# Patient Record
Sex: Male | Born: 1975 | Race: White | Hispanic: No | Marital: Single | Smoking: Current every day smoker
Health system: Southern US, Community
[De-identification: ages and names within clinical notes are randomized; demographics above are authoritative.]

## PROBLEM LIST (undated history)

## (undated) DIAGNOSIS — N289 Disorder of kidney and ureter, unspecified: Secondary | ICD-10-CM

## (undated) DIAGNOSIS — N200 Calculus of kidney: Secondary | ICD-10-CM

## (undated) DIAGNOSIS — M549 Dorsalgia, unspecified: Secondary | ICD-10-CM

## (undated) DIAGNOSIS — F1910 Other psychoactive substance abuse, uncomplicated: Secondary | ICD-10-CM

## (undated) HISTORY — PX: WRIST SURGERY: SHX841

---

## 2008-12-07 ENCOUNTER — Emergency Department (HOSPITAL_COMMUNITY): Admission: EM | Admit: 2008-12-07 | Discharge: 2008-12-07 | Payer: Self-pay | Admitting: Emergency Medicine

## 2009-09-27 ENCOUNTER — Emergency Department (HOSPITAL_COMMUNITY): Admission: EM | Admit: 2009-09-27 | Discharge: 2009-09-27 | Payer: Self-pay | Admitting: Emergency Medicine

## 2010-01-19 ENCOUNTER — Emergency Department (HOSPITAL_BASED_OUTPATIENT_CLINIC_OR_DEPARTMENT_OTHER): Admission: EM | Admit: 2010-01-19 | Discharge: 2010-01-19 | Payer: Self-pay | Admitting: Emergency Medicine

## 2010-02-25 ENCOUNTER — Emergency Department (HOSPITAL_BASED_OUTPATIENT_CLINIC_OR_DEPARTMENT_OTHER): Admission: EM | Admit: 2010-02-25 | Discharge: 2010-02-25 | Payer: Self-pay | Admitting: Emergency Medicine

## 2010-03-29 ENCOUNTER — Emergency Department (HOSPITAL_BASED_OUTPATIENT_CLINIC_OR_DEPARTMENT_OTHER): Admission: EM | Admit: 2010-03-29 | Discharge: 2010-03-29 | Payer: Self-pay | Admitting: Emergency Medicine

## 2010-05-12 ENCOUNTER — Emergency Department (HOSPITAL_COMMUNITY): Admission: EM | Admit: 2010-05-12 | Discharge: 2010-05-12 | Payer: Self-pay | Admitting: Emergency Medicine

## 2010-07-21 ENCOUNTER — Emergency Department (HOSPITAL_COMMUNITY): Admission: EM | Admit: 2010-07-21 | Discharge: 2010-07-21 | Payer: Self-pay | Admitting: Emergency Medicine

## 2010-08-16 ENCOUNTER — Emergency Department (HOSPITAL_COMMUNITY): Admission: EM | Admit: 2010-08-16 | Discharge: 2010-08-16 | Payer: Self-pay | Admitting: Emergency Medicine

## 2010-09-04 ENCOUNTER — Emergency Department (HOSPITAL_COMMUNITY): Admission: EM | Admit: 2010-09-04 | Discharge: 2010-09-04 | Payer: Self-pay | Admitting: Emergency Medicine

## 2010-10-25 ENCOUNTER — Emergency Department (HOSPITAL_BASED_OUTPATIENT_CLINIC_OR_DEPARTMENT_OTHER)
Admission: EM | Admit: 2010-10-25 | Discharge: 2010-10-25 | Payer: Self-pay | Source: Home / Self Care | Admitting: Emergency Medicine

## 2010-11-27 ENCOUNTER — Emergency Department (HOSPITAL_BASED_OUTPATIENT_CLINIC_OR_DEPARTMENT_OTHER)
Admission: EM | Admit: 2010-11-27 | Discharge: 2010-11-27 | Disposition: A | Discharge status: Home / Self Care | Payer: Self-pay | Attending: Emergency Medicine | Admitting: Emergency Medicine

## 2010-11-27 DIAGNOSIS — M545 Low back pain, unspecified: Secondary | ICD-10-CM | POA: Insufficient documentation

## 2010-11-27 DIAGNOSIS — F17200 Nicotine dependence, unspecified, uncomplicated: Secondary | ICD-10-CM | POA: Insufficient documentation

## 2010-11-27 DIAGNOSIS — G8929 Other chronic pain: Secondary | ICD-10-CM | POA: Insufficient documentation

## 2010-11-27 LAB — URINALYSIS, ROUTINE W REFLEX MICROSCOPIC
Bilirubin Urine: NEGATIVE
Hgb urine dipstick: NEGATIVE
Ketones, ur: NEGATIVE mg/dL
Nitrite: NEGATIVE
Specific Gravity, Urine: 1.024 (ref 1.005–1.030)
Urobilinogen, UA: 0.2 mg/dL (ref 0.0–1.0)

## 2010-12-08 ENCOUNTER — Emergency Department (HOSPITAL_BASED_OUTPATIENT_CLINIC_OR_DEPARTMENT_OTHER)
Admission: EM | Admit: 2010-12-08 | Discharge: 2010-12-08 | Disposition: A | Discharge status: Home / Self Care | Payer: Self-pay | Attending: Emergency Medicine | Admitting: Emergency Medicine

## 2010-12-08 ENCOUNTER — Emergency Department (INDEPENDENT_AMBULATORY_CARE_PROVIDER_SITE_OTHER): Payer: Self-pay

## 2010-12-08 DIAGNOSIS — X500XXA Overexertion from strenuous movement or load, initial encounter: Secondary | ICD-10-CM | POA: Insufficient documentation

## 2010-12-08 DIAGNOSIS — S335XXA Sprain of ligaments of lumbar spine, initial encounter: Secondary | ICD-10-CM | POA: Insufficient documentation

## 2010-12-08 DIAGNOSIS — G8929 Other chronic pain: Secondary | ICD-10-CM | POA: Insufficient documentation

## 2011-03-25 ENCOUNTER — Emergency Department (HOSPITAL_BASED_OUTPATIENT_CLINIC_OR_DEPARTMENT_OTHER)
Admission: EM | Admit: 2011-03-25 | Discharge: 2011-03-25 | Disposition: A | Discharge status: Home / Self Care | Payer: Self-pay | Attending: Emergency Medicine | Admitting: Emergency Medicine

## 2011-03-25 ENCOUNTER — Emergency Department (INDEPENDENT_AMBULATORY_CARE_PROVIDER_SITE_OTHER): Payer: Self-pay

## 2011-03-25 DIAGNOSIS — M549 Dorsalgia, unspecified: Secondary | ICD-10-CM | POA: Insufficient documentation

## 2011-03-25 DIAGNOSIS — M25519 Pain in unspecified shoulder: Secondary | ICD-10-CM

## 2011-03-25 DIAGNOSIS — Y9289 Other specified places as the place of occurrence of the external cause: Secondary | ICD-10-CM | POA: Insufficient documentation

## 2011-03-25 DIAGNOSIS — X500XXA Overexertion from strenuous movement or load, initial encounter: Secondary | ICD-10-CM | POA: Insufficient documentation

## 2011-03-25 DIAGNOSIS — F17200 Nicotine dependence, unspecified, uncomplicated: Secondary | ICD-10-CM | POA: Insufficient documentation

## 2011-03-25 DIAGNOSIS — G8929 Other chronic pain: Secondary | ICD-10-CM | POA: Insufficient documentation

## 2011-03-25 DIAGNOSIS — S4350XA Sprain of unspecified acromioclavicular joint, initial encounter: Secondary | ICD-10-CM | POA: Insufficient documentation

## 2011-08-01 ENCOUNTER — Encounter: Payer: Self-pay | Admitting: *Deleted

## 2011-08-01 ENCOUNTER — Emergency Department (HOSPITAL_BASED_OUTPATIENT_CLINIC_OR_DEPARTMENT_OTHER)
Admission: EM | Admit: 2011-08-01 | Discharge: 2011-08-01 | Disposition: A | Discharge status: Home / Self Care | Payer: Self-pay | Attending: Emergency Medicine | Admitting: Emergency Medicine

## 2011-08-01 ENCOUNTER — Emergency Department (INDEPENDENT_AMBULATORY_CARE_PROVIDER_SITE_OTHER): Payer: Self-pay

## 2011-08-01 DIAGNOSIS — W2209XA Striking against other stationary object, initial encounter: Secondary | ICD-10-CM | POA: Insufficient documentation

## 2011-08-01 DIAGNOSIS — S60229A Contusion of unspecified hand, initial encounter: Secondary | ICD-10-CM | POA: Insufficient documentation

## 2011-08-01 DIAGNOSIS — Y92009 Unspecified place in unspecified non-institutional (private) residence as the place of occurrence of the external cause: Secondary | ICD-10-CM | POA: Insufficient documentation

## 2011-08-01 DIAGNOSIS — M7989 Other specified soft tissue disorders: Secondary | ICD-10-CM

## 2011-08-01 DIAGNOSIS — M79609 Pain in unspecified limb: Secondary | ICD-10-CM

## 2011-08-01 DIAGNOSIS — F17200 Nicotine dependence, unspecified, uncomplicated: Secondary | ICD-10-CM | POA: Insufficient documentation

## 2011-08-01 MED ORDER — OXYCODONE-ACETAMINOPHEN 5-325 MG PO TABS: 2.0000 | ORAL_TABLET | ORAL | Status: AC | PRN

## 2011-08-01 MED ORDER — OXYCODONE-ACETAMINOPHEN 5-325 MG PO TABS
1.0000 | ORAL_TABLET | Freq: Once | ORAL | Status: AC
  Administered 2011-08-01: 1 via ORAL
  Filled 2011-08-01: qty 1

## 2011-08-01 NOTE — ED Notes (Signed)
Pt was throwing bags through a doorway and struck his left hand on the door. Pt has swelling and pain in his left hand.

## 2011-08-01 NOTE — ED Provider Notes (Signed)
History     CSN: [id number] Arrival date & time: 08/01/2011  7:14 PM   First MD Initiated Contact with Patient 08/01/11 1939      Chief Complaint  Patient presents with  . Hand Injury    (Consider location/radiation/quality/duration/timing/severity/associated sxs/prior treatment) Patient is a 35 y.o. male presenting with hand injury. The history is provided by the patient.  Hand Injury  The incident occurred 1 to 2 hours ago. The incident occurred at home. Injury mechanism: The patient was throwing objects and hit his left hand against a door frame causing pain to dorsal 2nd and 3rd digits proximally. The pain is at a severity of 10/10. The pain has been constant since the incident. He has tried nothing for the symptoms.    History reviewed. No pertinent past medical history.  Past Surgical History  Procedure Date  . Wrist surgery     No family history on file.  History  Substance Use Topics  . Smoking status: Current Everyday Smoker  . Smokeless tobacco: Not on file  . Alcohol Use: No      Review of Systems  Constitutional: Negative.   Respiratory: Negative.   Cardiovascular: Negative.   Musculoskeletal:       See HPI.  Skin: Negative.     Allergies  Darvocet; Morphine and related; and Ibuprofen  Home Medications  No current outpatient prescriptions on file.  BP 128/79  Pulse 80  Temp(Src) 98.5 F (36.9 C) (Oral)  Resp 18  Ht 6\' 1"  (1.854 m)  Wt 187 lb (84.823 kg)  BMI 24.67 kg/m2  SpO2 100%  Physical Exam  Constitutional: He appears well-developed and well-nourished.  Musculoskeletal:       Left hand without significant swelling, discoloration or bony abnormality. Tender over proximal 2nd and 3rd digits extending to 2nd and 3rd MCP joints. Patient refuses any movement secondary to pain. Distal vascularity normal.    ED Course  Procedures (including critical care time)  Labs Reviewed - No data to display Dg Hand Complete Left  08/01/2011   *RADIOLOGY REPORT*  Clinical Data: Struck doorway.  Swelling and pain.  Unable to fully extend hand.  LEFT HAND - COMPLETE 3+ VIEW  Comparison: 10/25/2010 left forearm  Findings: Views of the left hand show flexed digit, limiting full evaluation of the fingers.  There is no evidence for acute fracture or subluxation.  Well corticated bone fragments are seen adjacent to the distal ulna, consistent with old ulnar styloid fracture or accessory ossicles.  No radiopaque foreign body or soft tissue gas identified.  IMPRESSION:  1. No evidence for acute  abnormality. 2.  Partially flexed digits as described. If there is a specific concern regarding a finger, dedicated digit views would be recommended.  Original Report Authenticated By: Patterson Hammersmith, M.D.     No diagnosis found.    MDM          Rodena Medin, PA 08/01/11 2019

## 2011-08-02 NOTE — ED Provider Notes (Signed)
Medical screening examination/treatment/procedure(s) were performed by non-physician practitioner and as supervising physician I was immediately available for consultation/collaboration.   Lovena Kluck L Nikola Blackston, MD 08/02/11 0246 

## 2011-10-08 ENCOUNTER — Encounter (HOSPITAL_BASED_OUTPATIENT_CLINIC_OR_DEPARTMENT_OTHER): Payer: Self-pay | Admitting: *Deleted

## 2011-10-08 ENCOUNTER — Emergency Department (HOSPITAL_BASED_OUTPATIENT_CLINIC_OR_DEPARTMENT_OTHER)
Admission: EM | Admit: 2011-10-08 | Discharge: 2011-10-08 | Disposition: A | Discharge status: Home / Self Care | Payer: Self-pay | Attending: Emergency Medicine | Admitting: Emergency Medicine

## 2011-10-08 DIAGNOSIS — F17200 Nicotine dependence, unspecified, uncomplicated: Secondary | ICD-10-CM | POA: Insufficient documentation

## 2011-10-08 DIAGNOSIS — M5430 Sciatica, unspecified side: Secondary | ICD-10-CM | POA: Insufficient documentation

## 2011-10-08 MED ORDER — CYCLOBENZAPRINE HCL 5 MG PO TABS: 5.0000 mg | ORAL_TABLET | Freq: Two times a day (BID) | ORAL | Status: AC | PRN

## 2011-10-08 MED ORDER — HYDROCODONE-ACETAMINOPHEN 5-500 MG PO TABS: 1.0000 | ORAL_TABLET | Freq: Four times a day (QID) | ORAL | Status: AC | PRN

## 2011-10-08 MED ORDER — CYCLOBENZAPRINE HCL 10 MG PO TABS
5.0000 mg | ORAL_TABLET | Freq: Once | ORAL | Status: AC
  Administered 2011-10-08: 5 mg via ORAL
  Filled 2011-10-08: qty 1

## 2011-10-08 MED ORDER — HYDROCODONE-ACETAMINOPHEN 5-325 MG PO TABS
1.0000 | ORAL_TABLET | Freq: Once | ORAL | Status: AC
  Administered 2011-10-08: 1 via ORAL
  Filled 2011-10-08: qty 1
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
## 2011-10-08 NOTE — ED Notes (Signed)
Pt states he injured himself at work" lifting a saw" on Wed. Pain radiating down right leg and into groin.

## 2011-10-09 NOTE — ED Provider Notes (Signed)
Medical screening examination/treatment/procedure(s) were performed by non-physician practitioner and as supervising physician I was immediately available for consultation/collaboration.   Sabah Zucco, MD 10/09/11 2210 

## 2012-03-26 ENCOUNTER — Emergency Department (HOSPITAL_BASED_OUTPATIENT_CLINIC_OR_DEPARTMENT_OTHER): Payer: Self-pay

## 2012-03-26 ENCOUNTER — Encounter (HOSPITAL_BASED_OUTPATIENT_CLINIC_OR_DEPARTMENT_OTHER): Payer: Self-pay | Admitting: *Deleted

## 2012-03-26 ENCOUNTER — Emergency Department (HOSPITAL_BASED_OUTPATIENT_CLINIC_OR_DEPARTMENT_OTHER)
Admission: EM | Admit: 2012-03-26 | Discharge: 2012-03-26 | Disposition: A | Discharge status: Home / Self Care | Payer: Self-pay | Attending: Emergency Medicine | Admitting: Emergency Medicine

## 2012-03-26 DIAGNOSIS — Z79899 Other long term (current) drug therapy: Secondary | ICD-10-CM | POA: Insufficient documentation

## 2012-03-26 DIAGNOSIS — M25561 Pain in right knee: Secondary | ICD-10-CM

## 2012-03-26 DIAGNOSIS — Z885 Allergy status to narcotic agent status: Secondary | ICD-10-CM | POA: Insufficient documentation

## 2012-03-26 DIAGNOSIS — M25569 Pain in unspecified knee: Secondary | ICD-10-CM | POA: Insufficient documentation

## 2012-03-26 DIAGNOSIS — F17200 Nicotine dependence, unspecified, uncomplicated: Secondary | ICD-10-CM | POA: Insufficient documentation

## 2012-03-26 DIAGNOSIS — X500XXA Overexertion from strenuous movement or load, initial encounter: Secondary | ICD-10-CM | POA: Insufficient documentation

## 2012-03-26 DIAGNOSIS — Z886 Allergy status to analgesic agent status: Secondary | ICD-10-CM | POA: Insufficient documentation

## 2012-03-26 MED ORDER — LIDOCAINE 5 % EX PTCH: 1.0000 | MEDICATED_PATCH | CUTANEOUS | Status: AC

## 2012-03-26 NOTE — ED Provider Notes (Signed)
History     CSN: [id number]  Arrival date & time 03/26/12  1928   First MD Initiated Contact with Patient 03/26/12 2050      Chief Complaint  Patient presents with  . Knee Pain    (Consider location/radiation/quality/duration/timing/severity/associated sxs/prior treatment) Patient is a 36 y.o. male presenting with knee pain. The history is provided by the patient. No language interpreter was used.  Knee Pain This is a new problem. Episode onset: 3 days. The problem occurs constantly. The problem has been gradually worsening. Associated symptoms include joint swelling and myalgias. The symptoms are aggravated by bending. He has tried nothing for the symptoms. The treatment provided moderate relief.  Pt reports he has pain to his right knee.  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  History reviewed. No pertinent past medical history.  Past Surgical History  Procedure Date  . Wrist surgery     History reviewed. No pertinent family history.  History  Substance Use Topics  . Smoking status: Current Everyday Smoker -- 1.0 packs/day  . Smokeless tobacco: Not on file  . Alcohol Use: No      Review of Systems  Musculoskeletal: Positive for myalgias and joint swelling.  All other systems reviewed and are negative.    Allergies  Darvocet; Morphine and related; and Ibuprofen  Home Medications   Current Outpatient Rx  Name Route Sig Dispense Refill  . ACETAMINOPHEN 500 MG PO TABS Oral Take 500 mg by mouth every 6 (six) hours as needed. Patient used this medication for pain.    Marland Kitchen HYDROXYZINE HCL 50 MG PO TABS Oral Take 50 mg by mouth 3 (three) times daily as needed. Patient uses this medication for anxiety.    Marland Kitchen LISDEXAMFETAMINE DIMESYLATE 40 MG PO CAPS Oral Take 40 mg by mouth every morning.    Valetta Fuller HOT EX Apply externally Apply 1 application topically daily as needed. Patient used this medication for his knee pain.    . TRAZODONE HCL 150 MG PO TABS  Oral Take 150 mg by mouth at bedtime.      BP 142/87  Pulse 78  Temp 99 F (37.2 C) (Oral)  Resp 16  Ht 6\' 1"  (1.854 m)  Wt 181 lb (82.101 kg)  BMI 23.88 kg/m2  SpO2 100%  Physical Exam  Nursing note and vitals reviewed. Constitutional: He is oriented to person, place, and time. He appears well-developed and well-nourished.  HENT:  Head: Normocephalic and atraumatic.  Musculoskeletal: He exhibits tenderness.       No deformity no swelling,  No instability  Neurological: He is alert and oriented to person, place, and time. He has normal reflexes.  Skin: Skin is warm and dry.  Psychiatric: He has a normal mood and affect.    ED Course  Procedures (including critical care time)  Labs Reviewed - No data to display Dg Knee Complete 4 Views Right  03/26/2012  *RADIOLOGY REPORT*  Clinical Data: Knee pain, injury.  RIGHT KNEE - COMPLETE 4+ VIEW  Comparison: None.  Findings: No acute bony abnormality.  Specifically, no fracture, subluxation, or dislocation.  Soft tissues are intact.  No joint effusion.  IMPRESSION: Normal study.  Original Report Authenticated By: Cyndie Chime, M.D.     No diagnosis found.    MDM  Pt request cortisone injection.  I advised that we do not do here.  I advised pt to see Orthopaedist for evaluation.       Verlon Au  [place] Bouchard Barrett, [place] 03/26/12 2143
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
## 2012-03-26 NOTE — Discharge Instructions (Signed)
Knee Pain The knee is the complex joint between your thigh and your lower leg. It is made up of bones, tendons, ligaments, and cartilage. The bones that make up the knee are:  The femur in the thigh.   The tibia and fibula in the lower leg.   The patella or kneecap riding in the groove on the lower femur.  CAUSES  Knee pain is a common complaint with many causes. A few of these causes are:  Injury, such as:   A ruptured ligament or tendon injury.   Torn cartilage.   Medical conditions, such as:   Gout   Arthritis   Infections   Overuse, over training or overdoing a physical activity.  Knee pain can be minor or severe. Knee pain can accompany debilitating injury. Minor knee problems often respond well to self-care measures or get well on their own. More serious injuries may need medical intervention or even surgery. SYMPTOMS The knee is complex. Symptoms of knee problems can vary widely. Some of the problems are:  Pain with movement and weight bearing.   Swelling and tenderness.   Buckling of the knee.   Inability to straighten or extend your knee.   Your knee locks and you cannot straighten it.   Warmth and redness with pain and fever.   Deformity or dislocation of the kneecap.  DIAGNOSIS  Determining what is wrong may be very straight forward such as when there is an injury. It can also be challenging because of the complexity of the knee. Tests to make a diagnosis may include:  Your caregiver taking a history and doing a physical exam.   Routine X-rays can be used to rule out other problems. X-rays will not reveal a cartilage tear. Some injuries of the knee can be diagnosed by:   Arthroscopy a surgical technique by which a small video camera is inserted through tiny incisions on the sides of the knee. This procedure is used to examine and repair internal knee joint problems. Tiny instruments can be used during arthroscopy to repair the torn knee cartilage  (meniscus).   Arthrography is a radiology technique. A contrast liquid is directly injected into the knee joint. Internal structures of the knee joint then become visible on X-ray film.   An MRI scan is a non x-ray radiology procedure in which magnetic fields and a computer produce two- or three-dimensional images of the inside of the knee. Cartilage tears are often visible using an MRI scanner. MRI scans have largely replaced arthrography in diagnosing cartilage tears of the knee.   Blood work.   Examination of the fluid that helps to lubricate the knee joint (synovial fluid). This is done by taking a sample out using a needle and a syringe.  TREATMENT The treatment of knee problems depends on the cause. Some of these treatments are:  Depending on the injury, proper casting, splinting, surgery or physical therapy care will be needed.   Give yourself adequate recovery time. Do not overuse your joints. If you begin to get sore during workout routines, back off. Slow down or do fewer repetitions.   For repetitive activities such as cycling or running, maintain your strength and nutrition.   Alternate muscle groups. For example if you are a weight lifter, work the upper body on one day and the lower body the next.   Either tight or weak muscles do not give the proper support for your knee. Tight or weak muscles do not absorb the stress placed   on the knee joint. Keep the muscles surrounding the knee strong.   Take care of mechanical problems.   If you have flat feet, orthotics or special shoes may help. See your caregiver if you need help.   Arch supports, sometimes with wedges on the inner or outer aspect of the heel, can help. These can shift pressure away from the side of the knee most bothered by osteoarthritis.   A brace called an "unloader" brace also may be used to help ease the pressure on the most arthritic side of the knee.   If your caregiver has prescribed crutches, braces,  wraps or ice, use as directed. The acronym for this is PRICE. This means protection, rest, ice, compression and elevation.   Nonsteroidal anti-inflammatory drugs (NSAID's), can help relieve pain. But if taken immediately after an injury, they may actually increase swelling. Take NSAID's with food in your stomach. Stop them if you develop stomach problems. Do not take these if you have a history of ulcers, stomach pain or bleeding from the bowel. Do not take without your caregiver's approval if you have problems with fluid retention, heart failure, or kidney problems.   For ongoing knee problems, physical therapy may be helpful.   Glucosamine and chondroitin are over-the-counter dietary supplements. Both may help relieve the pain of osteoarthritis in the knee. These medicines are different from the usual anti-inflammatory drugs. Glucosamine may decrease the rate of cartilage destruction.   Injections of a corticosteroid drug into your knee joint may help reduce the symptoms of an arthritis flare-up. They may provide pain relief that lasts a few months. You may have to wait a few months between injections. The injections do have a small increased risk of infection, water retention and elevated blood sugar levels.   Hyaluronic acid injected into damaged joints may ease pain and provide lubrication. These injections may work by reducing inflammation. A series of shots may give relief for as long as 6 months.   Topical painkillers. Applying certain ointments to your skin may help relieve the pain and stiffness of osteoarthritis. Ask your pharmacist for suggestions. Many over the-counter products are approved for temporary relief of arthritis pain.   In some countries, doctors often prescribe topical NSAID's for relief of chronic conditions such as arthritis and tendinitis. A review of treatment with NSAID creams found that they worked as well as oral medications but without the serious side effects.    PREVENTION  Maintain a healthy weight. Extra pounds put more strain on your joints.   Get strong, stay limber. Weak muscles are a common cause of knee injuries. Stretching is important. Include flexibility exercises in your workouts.   Be smart about exercise. If you have osteoarthritis, chronic knee pain or recurring injuries, you may need to change the way you exercise. This does not mean you have to stop being active. If your knees ache after jogging or playing basketball, consider switching to swimming, water aerobics or other low-impact activities, at least for a few days a week. Sometimes limiting high-impact activities will provide relief.   Make sure your shoes fit well. Choose footwear that is right for your sport.   Protect your knees. Use the proper gear for knee-sensitive activities. Use kneepads when playing volleyball or laying carpet. Buckle your seat belt every time you drive. Most shattered kneecaps occur in car accidents.   Rest when you are tired.  SEEK MEDICAL CARE IF:  You have knee pain that is continual and does not   seem to be getting better.  SEEK IMMEDIATE MEDICAL CARE IF:  Your knee joint feels hot to the touch and you have a high fever. MAKE SURE YOU:   Understand these instructions.   Will watch your condition.   Will get help right away if you are not doing well or get worse.  Document Released: 07/24/2007 Document Revised: 09/15/2011 Document Reviewed: 07/24/2007 [employer] Patient Information 2012 [employer]. 

## 2012-03-26 NOTE — ED Notes (Signed)
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 Pt reports pain in upper knee when ambulating

## 2012-03-26 NOTE — ED Provider Notes (Signed)
Medical screening examination/treatment/procedure(s) were performed by non-physician practitioner and as supervising physician I was immediately available for consultation/collaboration.   Amorita Vanrossum, MD 03/26/12 2343 

## 2012-03-26 NOTE — ED Notes (Signed)
Pt c/o right knee pain x 3 days.  

## 2012-08-17 ENCOUNTER — Emergency Department (HOSPITAL_BASED_OUTPATIENT_CLINIC_OR_DEPARTMENT_OTHER): Payer: Self-pay

## 2012-08-17 ENCOUNTER — Emergency Department (HOSPITAL_BASED_OUTPATIENT_CLINIC_OR_DEPARTMENT_OTHER)
Admission: EM | Admit: 2012-08-17 | Discharge: 2012-08-17 | Disposition: A | Discharge status: Home / Self Care | Payer: Self-pay | Attending: Emergency Medicine | Admitting: Emergency Medicine

## 2012-08-17 ENCOUNTER — Encounter (HOSPITAL_BASED_OUTPATIENT_CLINIC_OR_DEPARTMENT_OTHER): Payer: Self-pay | Admitting: *Deleted

## 2012-08-17 DIAGNOSIS — M25569 Pain in unspecified knee: Secondary | ICD-10-CM | POA: Insufficient documentation

## 2012-08-17 DIAGNOSIS — M25561 Pain in right knee: Secondary | ICD-10-CM

## 2012-08-17 DIAGNOSIS — Y9389 Activity, other specified: Secondary | ICD-10-CM | POA: Insufficient documentation

## 2012-08-17 DIAGNOSIS — Z79899 Other long term (current) drug therapy: Secondary | ICD-10-CM | POA: Insufficient documentation

## 2012-08-17 DIAGNOSIS — F17200 Nicotine dependence, unspecified, uncomplicated: Secondary | ICD-10-CM | POA: Insufficient documentation

## 2012-08-17 DIAGNOSIS — Y929 Unspecified place or not applicable: Secondary | ICD-10-CM | POA: Insufficient documentation

## 2012-08-17 DIAGNOSIS — X58XXXA Exposure to other specified factors, initial encounter: Secondary | ICD-10-CM | POA: Insufficient documentation

## 2012-08-17 DIAGNOSIS — Y990 Civilian activity done for income or pay: Secondary | ICD-10-CM | POA: Insufficient documentation

## 2012-08-17 NOTE — ED Provider Notes (Signed)
History     CSN: [id number]  Arrival date & time 08/17/12  1420   First MD Initiated Contact with Patient 08/17/12 1653      Chief Complaint  Patient presents with  . Knee Pain    (Consider location/radiation/quality/duration/timing/severity/associated sxs/prior treatment) HPI Comments: Was using carpet stretcher, injured right knee.  Patient is a 36 y.o. male presenting with knee pain. The history is provided by the patient.  Knee Pain This is a new problem. Episode onset: today. The problem occurs constantly. The problem has not changed since onset.The symptoms are aggravated by walking. Nothing relieves the symptoms.    History reviewed. No pertinent past medical history.  Past Surgical History  Procedure Date  . Wrist surgery     History reviewed. No pertinent family history.  History  Substance Use Topics  . Smoking status: Current Every Day Smoker -- 1.0 packs/day  . Smokeless tobacco: Not on file  . Alcohol Use: No      Review of Systems  All other systems reviewed and are negative.    Allergies  Darvocet; Morphine and related; Ibuprofen; and Ultram  Home Medications   Current Outpatient Rx  Name  Route  Sig  Dispense  Refill  . ACETAMINOPHEN 500 MG PO TABS   Oral   Take 500 mg by mouth every 6 (six) hours as needed. Patient used this medication for pain.         Marland Kitchen HYDROXYZINE HCL 50 MG PO TABS   Oral   Take 50 mg by mouth 3 (three) times daily as needed. Patient uses this medication for anxiety.         Marland Kitchen LISDEXAMFETAMINE DIMESYLATE 40 MG PO CAPS   Oral   Take 40 mg by mouth every morning.         Valetta Fuller HOT EX   Apply externally   Apply 1 application topically daily as needed. Patient used this medication for his knee pain.         . TRAZODONE HCL 150 MG PO TABS   Oral   Take 150 mg by mouth at bedtime.           BP 124/81  Pulse 76  Temp 98.2 F (36.8 C) (Oral)  Resp 18  SpO2 100%  Physical Exam  Nursing note and  vitals reviewed. Constitutional: He is oriented to person, place, and time. He appears well-developed and well-nourished. No distress.  HENT:  Head: Normocephalic.  Neck: Normal range of motion. Neck supple.  Musculoskeletal:       The right knee appears grossly normal.  There is ttp superior to the patella but no swelling or effusion.  The knee is stable ap, laterally.  Negative ap drawer test.  Neurological: He is alert and oriented to person, place, and time.  Skin: Skin is warm and dry. He is not diaphoretic.    ED Course  Procedures (including critical care time)  Labs Reviewed - No data to display No results found.   No diagnosis found.    MDM  Xrays look okay, exam unremarkable.  Will treat as sprain/contusion.        Geoffery Lyons, MD 08/18/12 [phone]

## 2012-08-17 NOTE — ED Notes (Signed)
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

## 2012-12-11 ENCOUNTER — Emergency Department (HOSPITAL_BASED_OUTPATIENT_CLINIC_OR_DEPARTMENT_OTHER)
Admission: EM | Admit: 2012-12-11 | Discharge: 2012-12-11 | Disposition: A | Discharge status: Home / Self Care | Payer: Self-pay | Attending: Emergency Medicine | Admitting: Emergency Medicine

## 2012-12-11 ENCOUNTER — Encounter (HOSPITAL_BASED_OUTPATIENT_CLINIC_OR_DEPARTMENT_OTHER): Payer: Self-pay | Admitting: *Deleted

## 2012-12-11 DIAGNOSIS — Z79899 Other long term (current) drug therapy: Secondary | ICD-10-CM | POA: Insufficient documentation

## 2012-12-11 DIAGNOSIS — F17200 Nicotine dependence, unspecified, uncomplicated: Secondary | ICD-10-CM | POA: Insufficient documentation

## 2012-12-11 DIAGNOSIS — K029 Dental caries, unspecified: Secondary | ICD-10-CM | POA: Insufficient documentation

## 2012-12-11 DIAGNOSIS — R220 Localized swelling, mass and lump, head: Secondary | ICD-10-CM | POA: Insufficient documentation

## 2012-12-11 MED ORDER — PENICILLIN V POTASSIUM 500 MG PO TABS: 500.0000 mg | ORAL_TABLET | Freq: Four times a day (QID) | ORAL | Status: AC

## 2012-12-11 MED ORDER — OXYCODONE-ACETAMINOPHEN 5-325 MG PO TABS: 1.0000 | ORAL_TABLET | Freq: Four times a day (QID) | ORAL | Status: DC | PRN

## 2012-12-11 NOTE — ED Notes (Signed)
States his wisdom tooth broke and he is having pain.

## 2012-12-11 NOTE — ED Provider Notes (Addendum)
History     CSN: [id number]  Arrival date & time 12/11/12  4098   First MD Initiated Contact with Patient 12/11/12 1852      Chief Complaint  Patient presents with  . Dental Pain    (Consider location/radiation/quality/duration/timing/severity/associated sxs/prior treatment) Patient is a 37 y.o. male presenting with tooth pain. The history is provided by the patient.  Dental PainThe primary symptoms include mouth pain. The symptoms began 2 days ago. The symptoms are worsening. The symptoms are recurrent. The symptoms occur constantly.  Additional symptoms include: dental sensitivity to temperature, gum tenderness, jaw pain and facial swelling. Additional symptoms do not include: trouble swallowing and pain with swallowing. Associated symptoms comments: Pt states he had a wisdom tooth that broke off and 2 days ago it started swelling and caused facial swelling.  Pt states he was gargling with salt water and the facial swelling resolved but the pain has persisted.. Medical issues include: smoking and periodontal disease.    History reviewed. No pertinent past medical history.  Past Surgical History  Procedure Laterality Date  . Wrist surgery      No family history on file.  History  Substance Use Topics  . Smoking status: Current Every Day Smoker -- 1.00 packs/day  . Smokeless tobacco: Not on file  . Alcohol Use: No      Review of Systems  HENT: Positive for facial swelling. Negative for trouble swallowing.   All other systems reviewed and are negative.    Allergies  Darvocet; Morphine and related; Ibuprofen; and Ultram  Home Medications   Current Outpatient Rx  Name  Route  Sig  Dispense  Refill  . acetaminophen (TYLENOL) 500 MG tablet   Oral   Take 500 mg by mouth every 6 (six) hours as needed. Patient used this medication for pain.         . hydrOXYzine (ATARAX/VISTARIL) 50 MG tablet   Oral   Take 50 mg by mouth 3 (three) times daily as needed. Patient uses  this medication for anxiety.         Marland Kitchen lisdexamfetamine (VYVANSE) 40 MG capsule   Oral   Take 40 mg by mouth every morning.         . Menthol, Topical Analgesic, (ICY HOT EX)   Apply externally   Apply 1 application topically daily as needed. Patient used this medication for his knee pain.         . traZODone (DESYREL) 150 MG tablet   Oral   Take 150 mg by mouth at bedtime.           BP 135/75  Pulse 73  Temp(Src) 98.5 F (36.9 C) (Oral)  Resp 16  Wt 181 lb (82.101 kg)  BMI 23.89 kg/m2  SpO2 99%  Physical Exam  Nursing note and vitals reviewed. Constitutional: He is oriented to person, place, and time. He appears well-developed and well-nourished. No distress.  HENT:  Head: Normocephalic and atraumatic. No trismus in the jaw.  Mouth/Throat: Oropharynx is clear and moist. Dental caries present. No dental abscesses or edematous.    Eyes: Conjunctivae and EOM are normal. Pupils are equal, round, and reactive to light.  Neck: Normal range of motion. Neck supple.  Cardiovascular: Normal rate.   Pulmonary/Chest: Effort normal.  Musculoskeletal: Normal range of motion. He exhibits no edema and no tenderness.  Lymphadenopathy:    He has no cervical adenopathy.  Neurological: He is alert and oriented to person, place, and time.  Skin: Skin  is warm and dry. No rash noted. No erythema.  Psychiatric: He has a normal mood and affect. His behavior is normal.    ED Course  Procedures (including critical care time)  Labs Reviewed - No data to display No results found.   1. Dental caries       MDM   Pt with dental caries and facial swelling.  No signs of ludwig's angina or difficulty swallowing and no systemic symptoms. Will treat with PCN and pt has f/u with dentist next month for teeth removal.         Gwyneth Sprout, MD 12/11/12 1858  Gwyneth Sprout, MD 12/11/12 1900

## 2013-01-01 ENCOUNTER — Emergency Department (HOSPITAL_BASED_OUTPATIENT_CLINIC_OR_DEPARTMENT_OTHER)
Admission: EM | Admit: 2013-01-01 | Discharge: 2013-01-01 | Disposition: A | Discharge status: Home / Self Care | Payer: Self-pay | Attending: Emergency Medicine | Admitting: Emergency Medicine

## 2013-01-01 ENCOUNTER — Encounter (HOSPITAL_BASED_OUTPATIENT_CLINIC_OR_DEPARTMENT_OTHER): Payer: Self-pay | Admitting: *Deleted

## 2013-01-01 ENCOUNTER — Emergency Department (HOSPITAL_BASED_OUTPATIENT_CLINIC_OR_DEPARTMENT_OTHER): Payer: Self-pay

## 2013-01-01 DIAGNOSIS — Y990 Civilian activity done for income or pay: Secondary | ICD-10-CM | POA: Insufficient documentation

## 2013-01-01 DIAGNOSIS — Y9289 Other specified places as the place of occurrence of the external cause: Secondary | ICD-10-CM | POA: Insufficient documentation

## 2013-01-01 DIAGNOSIS — S93409A Sprain of unspecified ligament of unspecified ankle, initial encounter: Secondary | ICD-10-CM | POA: Insufficient documentation

## 2013-01-01 DIAGNOSIS — X500XXA Overexertion from strenuous movement or load, initial encounter: Secondary | ICD-10-CM | POA: Insufficient documentation

## 2013-01-01 DIAGNOSIS — F17200 Nicotine dependence, unspecified, uncomplicated: Secondary | ICD-10-CM | POA: Insufficient documentation

## 2013-01-01 DIAGNOSIS — S93402A Sprain of unspecified ligament of left ankle, initial encounter: Secondary | ICD-10-CM

## 2013-01-01 DIAGNOSIS — Y939 Activity, unspecified: Secondary | ICD-10-CM | POA: Insufficient documentation

## 2013-01-01 MED ORDER — HYDROCODONE-ACETAMINOPHEN 5-325 MG PO TABS
2.0000 | ORAL_TABLET | Freq: Once | ORAL | Status: AC
  Administered 2013-01-01: 2 via ORAL
  Filled 2013-01-01: qty 2

## 2013-01-01 NOTE — ED Notes (Signed)
Pt to rooom 12 in w/c, reports "I twisted my ankle at work just now..." pt reports pain to inner ankle area, area is tend to palp, has not been able to wb.

## 2013-01-01 NOTE — ED Provider Notes (Signed)
History     CSN: [id number]  Arrival date & time 01/01/13  1147   First MD Initiated Contact with Patient 01/01/13 1243      Chief Complaint  Patient presents with  . Ankle Pain    (Consider location/radiation/quality/duration/timing/severity/associated sxs/prior treatment) Patient is a 37 y.o. male presenting with ankle pain. The history is provided by the patient. No language interpreter was used.  Ankle Pain Location:  Ankle Time since incident:  1 hour Injury: yes   Ankle location:  L ankle Pain details:    Quality:  Aching   Severity:  Mild   Onset quality:  Gradual   Timing:  Constant Dislocation: no   Foreign body present:  No foreign bodies Worsened by:  Nothing tried Associated symptoms: swelling     History reviewed. No pertinent past medical history.  Past Surgical History  Procedure Laterality Date  . Wrist surgery      History reviewed. No pertinent family history.  History  Substance Use Topics  . Smoking status: Current Every Day Smoker -- 1.00 packs/day  . Smokeless tobacco: Not on file  . Alcohol Use: No      Review of Systems  Musculoskeletal: Positive for joint swelling.  All other systems reviewed and are negative.    Allergies  Darvocet; Morphine and related; Ibuprofen; and Ultram  Home Medications   Current Outpatient Rx  Name  Route  Sig  Dispense  Refill  . acetaminophen (TYLENOL) 500 MG tablet   Oral   Take 500 mg by mouth every 6 (six) hours as needed. Patient used this medication for pain.         . hydrOXYzine (ATARAX/VISTARIL) 50 MG tablet   Oral   Take 50 mg by mouth 3 (three) times daily as needed. Patient uses this medication for anxiety.         Marland Kitchen lisdexamfetamine (VYVANSE) 40 MG capsule   Oral   Take 40 mg by mouth every morning.         . Menthol, Topical Analgesic, (ICY HOT EX)   Apply externally   Apply 1 application topically daily as needed. Patient used this medication for his knee pain.          Marland Kitchen oxyCODONE-acetaminophen (PERCOCET/ROXICET) 5-325 MG per tablet   Oral   Take 1 tablet by mouth every 6 (six) hours as needed for pain.   15 tablet   0   . traZODone (DESYREL) 150 MG tablet   Oral   Take 150 mg by mouth at bedtime.           BP 123/78  Pulse 77  Temp(Src) 98.7 F (37.1 C) (Oral)  Resp 16  Ht 6\' 1"  (1.854 m)  Wt 187 lb (84.823 kg)  BMI 24.68 kg/m2  SpO2 97%  Physical Exam  Nursing note and vitals reviewed. Constitutional: He is oriented to person, place, and time. He appears well-developed and well-nourished.  HENT:  Head: Normocephalic and atraumatic.  Musculoskeletal: He exhibits tenderness.  Swollen tender left ankle,  From,  nv and ns intact,  Tender medial malleolus  Neurological: He is alert and oriented to person, place, and time. He has normal reflexes.  Skin: Skin is warm.  Psychiatric: He has a normal mood and affect.    ED Course  Procedures (including critical care time)  Labs Reviewed - No data to display Dg Ankle Complete Left  01/01/2013  *RADIOLOGY REPORT*  Clinical Data: Left ankle pain after injury.  LEFT ANKLE  COMPLETE - 3+ VIEW  Comparison: None.  Findings: No fracture or dislocation is noted.  Joint spaces are intact. No soft tissue abnormality is noted.  IMPRESSION: Normal left ankle.   Original Report Authenticated By: Lupita Raider.,  M.D.      No diagnosis found.    MDM  [place], crutches,  Hydrocodone.   Follow up with Dr. Pearletha Forge for recheck in 1 week,          Elson Areas, PA-C 01/01/13 1258

## 2013-01-01 NOTE — ED Provider Notes (Signed)
Medical screening examination/treatment/procedure(s) were performed by non-physician practitioner and as supervising physician I was immediately available for consultation/collaboration.   Carleene Cooper III, MD 01/01/13 2147

## 2013-03-28 ENCOUNTER — Encounter (HOSPITAL_BASED_OUTPATIENT_CLINIC_OR_DEPARTMENT_OTHER): Payer: Self-pay | Admitting: *Deleted

## 2013-03-28 ENCOUNTER — Emergency Department (HOSPITAL_BASED_OUTPATIENT_CLINIC_OR_DEPARTMENT_OTHER)
Admission: EM | Admit: 2013-03-28 | Discharge: 2013-03-28 | Disposition: A | Discharge status: Home / Self Care | Payer: Self-pay | Attending: Emergency Medicine | Admitting: Emergency Medicine

## 2013-03-28 ENCOUNTER — Emergency Department (HOSPITAL_BASED_OUTPATIENT_CLINIC_OR_DEPARTMENT_OTHER): Payer: Self-pay

## 2013-03-28 DIAGNOSIS — F17200 Nicotine dependence, unspecified, uncomplicated: Secondary | ICD-10-CM | POA: Insufficient documentation

## 2013-03-28 DIAGNOSIS — N201 Calculus of ureter: Secondary | ICD-10-CM | POA: Insufficient documentation

## 2013-03-28 DIAGNOSIS — R109 Unspecified abdominal pain: Secondary | ICD-10-CM | POA: Insufficient documentation

## 2013-03-28 DIAGNOSIS — Z79899 Other long term (current) drug therapy: Secondary | ICD-10-CM | POA: Insufficient documentation

## 2013-03-28 LAB — URINALYSIS, ROUTINE W REFLEX MICROSCOPIC
Glucose, UA: NEGATIVE mg/dL
Hgb urine dipstick: NEGATIVE
Protein, ur: NEGATIVE mg/dL
Urobilinogen, UA: 1 mg/dL (ref 0.0–1.0)

## 2013-03-28 LAB — BASIC METABOLIC PANEL
BUN: 10 mg/dL (ref 6–23)
Calcium: 9.9 mg/dL (ref 8.4–10.5)
Creatinine, Ser: 1 mg/dL (ref 0.50–1.35)
GFR calc non Af Amer: >90 mL/min (ref >90)
Glucose, Bld: 122 mg/dL — ABNORMAL HIGH (ref 70–99)

## 2013-03-28 MED ORDER — HYDROMORPHONE HCL PF 1 MG/ML IJ SOLN
1.0000 mg | Freq: Once | INTRAMUSCULAR | Status: AC
  Administered 2013-03-28: 1 mg via INTRAVENOUS
  Filled 2013-03-28: qty 1

## 2013-03-28 MED ORDER — TAMSULOSIN HCL 0.4 MG PO CAPS: 0.4000 mg | ORAL_CAPSULE | Freq: Every day | ORAL | Status: DC

## 2013-03-28 MED ORDER — KETOROLAC TROMETHAMINE 30 MG/ML IJ SOLN
30.0000 mg | Freq: Once | INTRAMUSCULAR | Status: AC
  Administered 2013-03-28: 30 mg via INTRAVENOUS
  Filled 2013-03-28: qty 1

## 2013-03-28 MED ORDER — ONDANSETRON HCL 4 MG/2ML IJ SOLN
4.0000 mg | Freq: Once | INTRAMUSCULAR | Status: AC
  Administered 2013-03-28: 4 mg via INTRAVENOUS
  Filled 2013-03-28: qty 2

## 2013-03-28 MED ORDER — OXYCODONE-ACETAMINOPHEN 5-325 MG PO TABS: 1.0000 | ORAL_TABLET | ORAL | Status: DC | PRN

## 2013-03-28 NOTE — ED Notes (Signed)
Pt. Reports to RN his mother drove him here.

## 2013-03-28 NOTE — ED Notes (Signed)
Hematuria and back pain x 2 days.

## 2013-03-28 NOTE — ED Provider Notes (Signed)
History     CSN: [id number]  Arrival date & time 03/28/13  4098   First MD Initiated Contact with Patient 03/28/13 1837      Chief Complaint  Patient presents with  . Hematuria    (Consider location/radiation/quality/duration/timing/severity/associated sxs/prior treatment) HPI Comments: Pt states that he is having left flank pain with hematuria:pt states that he doesn't have a history of kidney stone, but does have a family history  Patient is a 37 y.o. male presenting with hematuria. The history is provided by the patient. No language interpreter was used.  Hematuria This is a new problem. The current episode started in the past 7 days. The problem occurs intermittently. The problem has been unchanged. Pertinent negatives include no abdominal pain, fever or urinary symptoms. Nothing aggravates the symptoms. He has tried nothing for the symptoms.    History reviewed. No pertinent past medical history.  Past Surgical History  Procedure Laterality Date  . Wrist surgery      No family history on file.  History  Substance Use Topics  . Smoking status: Current Every Day Smoker -- 1.00 packs/day    Types: Cigarettes  . Smokeless tobacco: Not on file  . Alcohol Use: No      Review of Systems  Constitutional: Negative for fever.  Respiratory: Negative.   Cardiovascular: Negative.   Gastrointestinal: Negative for abdominal pain.  Genitourinary: Positive for hematuria.    Allergies  Darvocet; Morphine and related; Ibuprofen; and Ultram  Home Medications   Current Outpatient Rx  Name  Route  Sig  Dispense  Refill  . acetaminophen (TYLENOL) 500 MG tablet   Oral   Take 500 mg by mouth every 6 (six) hours as needed. Patient used this medication for pain.         . hydrOXYzine (ATARAX/VISTARIL) 50 MG tablet   Oral   Take 50 mg by mouth 3 (three) times daily as needed. Patient uses this medication for anxiety.         Marland Kitchen lisdexamfetamine (VYVANSE) 40 MG capsule    Oral   Take 40 mg by mouth every morning.         . Menthol, Topical Analgesic, (ICY HOT EX)   Apply externally   Apply 1 application topically daily as needed. Patient used this medication for his knee pain.         Marland Kitchen oxyCODONE-acetaminophen (PERCOCET/ROXICET) 5-325 MG per tablet   Oral   Take 1 tablet by mouth every 6 (six) hours as needed for pain.   15 tablet   0   . traZODone (DESYREL) 150 MG tablet   Oral   Take 150 mg by mouth at bedtime.           BP 126/86  Pulse 71  Temp(Src) 97.6 F (36.4 C) (Oral)  Resp 20  Ht 6\' 1"  (1.854 m)  Wt 196 lb (88.905 kg)  BMI 25.86 kg/m2  SpO2 98%  Physical Exam  Nursing note and vitals reviewed. Constitutional: He is oriented to person, place, and time. He appears well-developed and well-nourished.  HENT:  Head: Normocephalic and atraumatic.  Cardiovascular: Normal rate and regular rhythm.   Pulmonary/Chest: Effort normal and breath sounds normal.  Abdominal: Soft. Bowel sounds are normal. There is no tenderness.  Musculoskeletal: Normal range of motion.  Neurological: He is alert and oriented to person, place, and time.  Skin: Skin is warm and dry.  Psychiatric: He has a normal mood and affect.    ED Course  Procedures (including critical care time)  Labs Reviewed  BASIC METABOLIC PANEL - Abnormal; Notable for the following:    Glucose, Bld 122 (*)    All other components within normal limits  URINALYSIS, ROUTINE W REFLEX MICROSCOPIC   Ct Abdomen Pelvis Wo Contrast  03/28/2013   *RADIOLOGY REPORT*  Clinical Data: Left flank pain, hematuria  CT ABDOMEN AND PELVIS WITHOUT CONTRAST  Technique:  Multidetector CT imaging of the abdomen and pelvis was performed following the standard protocol without intravenous contrast.  Comparison: Lumbar spine radiographs 12/08/2010  Findings:  Lower Chest:  The lung bases are clear.  The visualized heart is within normal limits for size.  No pericardial effusion. Unremarkable distal  thoracic esophagus.  Abdomen: Unenhanced CT was performed per clinician order.  Lack of IV contrast limits sensitivity and specificity, especially for evaluation of abdominal/pelvic solid viscera.  Within these limitations, unremarkable CT appearance of the stomach, adrenal glands and pancreas.  Punctate calcifications throughout the splenic parenchyma suggest old granulomatous disease.  Normal hepatic contour.  No focal hepatic lesion. Gallbladder is unremarkable. No intra or extrahepatic biliary ductal dilatation.  No hydronephrosis or nephrolithiasis. There are two small (2 mm) calcifications along the expected course of the left ureter which may be within the testicular vein, or ureter.  Normal-caliber large and small bowel.  No evidence of obstruction.  No focal colonic wall thickening or evidence of diverticulitis.  Normal appendix in the right lower quadrant.  No free fluid or suspicious adenopathy.  Pelvis: Probable 2 mm left UVJ stone.  The bladder is otherwise unremarkable.  No free fluid or suspicious adenopathy.  Bones: No acute fracture or aggressive appearing lytic or blastic osseous lesion.  Vascular: No significant atherosclerotic vascular disease or aneurysmal dilatation.  IMPRESSION:  1.  No hydronephrosis or nephrolithiasis.  2.  Two small (2 mm) calcifications along the expected course of the left ureter may be within the ureter, or the adjacent testicular pain.  Given the patient's left-sided symptoms, a nonobstructing distal ureteral stone is suspected.   Original Report Authenticated By: Malachy Moan, M.D.     1. Ureteral stone       MDM  Pt is feeling better at this time:will send home with something for pain and flomax:pt given urology follow up        Teressa Lower, NP 03/28/13 2036

## 2013-03-28 NOTE — ED Notes (Signed)
Pt. Reports he is unable to urinate at this time.

## 2013-03-28 NOTE — ED Provider Notes (Signed)
Medical screening examination/treatment/procedure(s) were performed by non-physician practitioner and as supervising physician I was immediately available for consultation/collaboration.   Lexany Belknap, MD 03/28/13 2306 

## 2013-10-27 ENCOUNTER — Emergency Department (HOSPITAL_BASED_OUTPATIENT_CLINIC_OR_DEPARTMENT_OTHER)
Admission: EM | Admit: 2013-10-27 | Discharge: 2013-10-27 | Disposition: A | Discharge status: Home / Self Care | Payer: Self-pay | Attending: Emergency Medicine | Admitting: Emergency Medicine

## 2013-10-27 ENCOUNTER — Encounter (HOSPITAL_BASED_OUTPATIENT_CLINIC_OR_DEPARTMENT_OTHER): Payer: Self-pay | Admitting: Emergency Medicine

## 2013-10-27 DIAGNOSIS — K029 Dental caries, unspecified: Secondary | ICD-10-CM | POA: Insufficient documentation

## 2013-10-27 DIAGNOSIS — F17200 Nicotine dependence, unspecified, uncomplicated: Secondary | ICD-10-CM | POA: Insufficient documentation

## 2013-10-27 DIAGNOSIS — Z79899 Other long term (current) drug therapy: Secondary | ICD-10-CM | POA: Insufficient documentation

## 2013-10-27 MED ORDER — OXYCODONE-ACETAMINOPHEN 5-325 MG PO TABS: 1.0000 | ORAL_TABLET | Freq: Four times a day (QID) | ORAL | Status: DC | PRN

## 2013-10-27 MED ORDER — PENICILLIN V POTASSIUM 500 MG PO TABS: 500.0000 mg | ORAL_TABLET | Freq: Four times a day (QID) | ORAL | Status: AC

## 2013-10-27 NOTE — ED Provider Notes (Signed)
CSN: [id number]     Arrival date & time 10/27/13  0223 History   None    Chief Complaint  Patient presents with  . Dental Pain   (Consider location/radiation/quality/duration/timing/severity/associated sxs/prior Treatment) Patient is a 38 y.o. male presenting with tooth pain. The history is provided by the patient.  Dental Pain Location:  Upper Upper teeth location:  3/RU 1st molar and 2/RU 2nd molar Quality:  Pulsating Severity:  Severe Onset quality:  Sudden Timing:  Constant Progression:  Unchanged Chronicity:  Recurrent Context: dental fracture and poor dentition   Context: not trauma   Previous work-up:  Dental exam Relieved by:  Nothing Worsened by:  Nothing tried Ineffective treatments:  None tried Associated symptoms: no congestion and no fever   Risk factors: lack of dental care     History reviewed. No pertinent past medical history. Past Surgical History  Procedure Laterality Date  . Wrist surgery     No family history on file. History  Substance Use Topics  . Smoking status: Current Every Day Smoker -- 1.00 packs/day    Types: Cigarettes  . Smokeless tobacco: Not on file  . Alcohol Use: No    Review of Systems  Constitutional: Negative for fever.  HENT: Negative for congestion.   All other systems reviewed and are negative.    Allergies  Darvocet; Morphine and related; Ibuprofen; and Ultram  Home Medications   Current Outpatient Rx  Name  Route  Sig  Dispense  Refill  . acetaminophen (TYLENOL) 500 MG tablet   Oral   Take 500 mg by mouth every 6 (six) hours as needed. Patient used this medication for pain.         . hydrOXYzine (ATARAX/VISTARIL) 50 MG tablet   Oral   Take 50 mg by mouth 3 (three) times daily as needed. Patient uses this medication for anxiety.         Marland Kitchen lisdexamfetamine (VYVANSE) 40 MG capsule   Oral   Take 40 mg by mouth every morning.         . Menthol, Topical Analgesic, (ICY HOT EX)   Apply externally   Apply  1 application topically daily as needed. Patient used this medication for his knee pain.         Marland Kitchen oxyCODONE-acetaminophen (PERCOCET) 5-325 MG per tablet   Oral   Take 1 tablet by mouth every 6 (six) hours as needed.   9 tablet   0   . oxyCODONE-acetaminophen (PERCOCET/ROXICET) 5-325 MG per tablet   Oral   Take 1 tablet by mouth every 6 (six) hours as needed for pain.   15 tablet   0   . oxyCODONE-acetaminophen (PERCOCET/ROXICET) 5-325 MG per tablet   Oral   Take 1 tablet by mouth every 4 (four) hours as needed for pain.   10 tablet   0   . penicillin v potassium (VEETID) 500 MG tablet   Oral   Take 1 tablet (500 mg total) by mouth 4 (four) times daily.   28 tablet   0   . tamsulosin (FLOMAX) 0.4 MG CAPS   Oral   Take 1 capsule (0.4 mg total) by mouth daily.   30 capsule   0   . traZODone (DESYREL) 150 MG tablet   Oral   Take 150 mg by mouth at bedtime.          BP 141/93  Pulse 97  Resp 20  Ht 6\' 1"  (1.854 m)  Wt 170 lb (77.111  kg)  BMI 22.43 kg/m2  SpO2 99% Physical Exam  Constitutional: He is oriented to person, place, and time. He appears well-developed and well-nourished. No distress.  HENT:  Head: Normocephalic and atraumatic.  Mouth/Throat: Oropharynx is clear and moist. No trismus in the jaw. Dental caries present. No uvula swelling.    Eyes: Conjunctivae are normal. Pupils are equal, round, and reactive to light.  Neck: Normal range of motion. Neck supple.  Cardiovascular: Normal rate and regular rhythm.   Pulmonary/Chest: Effort normal and breath sounds normal. He has no wheezes.  Abdominal: Soft. Bowel sounds are normal.  Musculoskeletal: Normal range of motion.  Lymphadenopathy:    He has no cervical adenopathy.  Neurological: He is alert and oriented to person, place, and time.  Skin: Skin is warm and dry.  Psychiatric: He has a normal mood and affect.    ED Course  Procedures (including critical care time) Labs Review Labs Reviewed  - No data to display Imaging Review No results found.  EKG Interpretation   None       MDM   1. Dental caries    Penicillin pain medication and close follow up with a dentist   Valerie Cones K Pier Bosher-Rasch, MD 10/27/13 [phone]

## 2013-10-27 NOTE — Discharge Instructions (Signed)
Dental Caries °Dental caries is tooth decay. This decay can cause a hole in teeth (cavity) that can get bigger and deeper over time. °HOME CARE °· Brush and floss your teeth. Do this at least two times a day. °· Use a fluoride toothpaste. °· Use a mouth rinse if told by your dentist or doctor. °· Eat less sugary and starchy foods. Drink less sugary drinks. °· Avoid snacking often on sugary and starchy foods. Avoid sipping often on sugary drinks. °· Keep regular checkups and cleanings with your dentist. °· Use fluoride supplements if told by your dentist or doctor. °· Allow fluoride to be applied to teeth if told by your dentist or doctor. °MAKE SURE YOU: °· Understand these instructions. °· Will watch your condition. °· Will get help right away if you are not doing well or get worse. °Document Released: 07/05/2008 Document Revised: 05/29/2013 Document Reviewed: 09/28/2012 °[employer]® Patient Information ©2014 [employer]. ° °

## 2013-10-27 NOTE — ED Notes (Signed)
Pt reports dental pain/abscess to right upper area of gums - reports a broken tooth recently. Pt is eating ice in triage.

## 2013-12-27 ENCOUNTER — Emergency Department (HOSPITAL_BASED_OUTPATIENT_CLINIC_OR_DEPARTMENT_OTHER)
Admission: EM | Admit: 2013-12-27 | Discharge: 2013-12-27 | Disposition: A | Discharge status: Home / Self Care | Payer: Self-pay | Attending: Emergency Medicine | Admitting: Emergency Medicine

## 2013-12-27 ENCOUNTER — Encounter (HOSPITAL_BASED_OUTPATIENT_CLINIC_OR_DEPARTMENT_OTHER): Payer: Self-pay | Admitting: Emergency Medicine

## 2013-12-27 DIAGNOSIS — F17200 Nicotine dependence, unspecified, uncomplicated: Secondary | ICD-10-CM | POA: Insufficient documentation

## 2013-12-27 DIAGNOSIS — K089 Disorder of teeth and supporting structures, unspecified: Secondary | ICD-10-CM | POA: Insufficient documentation

## 2013-12-27 DIAGNOSIS — K0889 Other specified disorders of teeth and supporting structures: Secondary | ICD-10-CM

## 2013-12-27 DIAGNOSIS — Z79899 Other long term (current) drug therapy: Secondary | ICD-10-CM | POA: Insufficient documentation

## 2013-12-27 MED ORDER — PENICILLIN V POTASSIUM 500 MG PO TABS: 500.0000 mg | ORAL_TABLET | Freq: Four times a day (QID) | ORAL | Status: AC

## 2013-12-27 NOTE — Discharge Instructions (Signed)

## 2013-12-27 NOTE — ED Notes (Signed)
Right upper toothache x 4 days-hx of same approx 3-4 months ago-dentist appt 4/24

## 2013-12-27 NOTE — ED Provider Notes (Signed)
CSN: [id number]     Arrival date & time 12/27/13  1105 History   First MD Initiated Contact with Patient 12/27/13 1122     Chief Complaint  Patient presents with  . Dental Pain     (Consider location/radiation/quality/duration/timing/severity/associated sxs/prior Treatment) Patient is a 38 y.o. male presenting with tooth pain.  Dental Pain  Pt with long history of dental problems reports aching pain in R upper 4th tooth for the last 4 days. Worse with chewing. States he has dentist appointment in about 30 days.   History reviewed. No pertinent past medical history. Past Surgical History  Procedure Laterality Date  . Wrist surgery     No family history on file. History  Substance Use Topics  . Smoking status: Current Every Day Smoker -- 1.00 packs/day    Types: Cigarettes  . Smokeless tobacco: Never Used  . Alcohol Use: No    Review of Systems All other systems reviewed and are negative except as noted in HPI.     Allergies  Darvocet; Morphine and related; Ibuprofen; and Ultram  Home Medications   Current Outpatient Rx  Name  Route  Sig  Dispense  Refill  . acetaminophen (TYLENOL) 500 MG tablet   Oral   Take 500 mg by mouth every 6 (six) hours as needed. Patient used this medication for pain.         . hydrOXYzine (ATARAX/VISTARIL) 50 MG tablet   Oral   Take 50 mg by mouth 3 (three) times daily as needed. Patient uses this medication for anxiety.         Marland Kitchen lisdexamfetamine (VYVANSE) 40 MG capsule   Oral   Take 40 mg by mouth every morning.         . Menthol, Topical Analgesic, (ICY HOT EX)   Apply externally   Apply 1 application topically daily as needed. Patient used this medication for his knee pain.         Marland Kitchen oxyCODONE-acetaminophen (PERCOCET) 5-325 MG per tablet   Oral   Take 1 tablet by mouth every 6 (six) hours as needed.   9 tablet   0   . oxyCODONE-acetaminophen (PERCOCET/ROXICET) 5-325 MG per tablet   Oral   Take 1 tablet by mouth  every 6 (six) hours as needed for pain.   15 tablet   0   . oxyCODONE-acetaminophen (PERCOCET/ROXICET) 5-325 MG per tablet   Oral   Take 1 tablet by mouth every 4 (four) hours as needed for pain.   10 tablet   0   . tamsulosin (FLOMAX) 0.4 MG CAPS   Oral   Take 1 capsule (0.4 mg total) by mouth daily.   30 capsule   0   . traZODone (DESYREL) 150 MG tablet   Oral   Take 150 mg by mouth at bedtime.          BP 129/80  Pulse 82  Temp(Src) 98.3 F (36.8 C) (Oral)  Resp 16  Ht 6\' 1"  (1.854 m)  Wt 187 lb (84.823 kg)  BMI 24.68 kg/m2  SpO2 98% Physical Exam  Constitutional: He is oriented to person, place, and time. He appears well-developed and well-nourished.  HENT:  Head: Normocephalic and atraumatic.  Mouth/Throat:    Neck: Neck supple.  Pulmonary/Chest: Effort normal.  Neurological: He is alert and oriented to person, place, and time. No cranial nerve deficit.  Psychiatric: He has a normal mood and affect. His behavior is normal.    ED Course  Procedures (  including critical care time) Labs Review Labs Reviewed - No data to display Imaging Review No results found.   EKG Interpretation None      MDM   Final diagnoses:  Toothache    Dental caries, PCN. APAP. Dental followup.     Charles B. Bernette MayersSheldon, MD 12/27/13 1130

## 2014-11-26 ENCOUNTER — Emergency Department (HOSPITAL_COMMUNITY)
Admission: EM | Admit: 2014-11-26 | Discharge: 2014-11-26 | Disposition: A | Discharge status: Home / Self Care | Payer: Self-pay | Attending: Emergency Medicine | Admitting: Emergency Medicine

## 2014-11-26 ENCOUNTER — Encounter (HOSPITAL_COMMUNITY): Payer: Self-pay | Admitting: Emergency Medicine

## 2014-11-26 DIAGNOSIS — R6889 Other general symptoms and signs: Secondary | ICD-10-CM

## 2014-11-26 DIAGNOSIS — Y9289 Other specified places as the place of occurrence of the external cause: Secondary | ICD-10-CM | POA: Insufficient documentation

## 2014-11-26 DIAGNOSIS — X58XXXA Exposure to other specified factors, initial encounter: Secondary | ICD-10-CM | POA: Insufficient documentation

## 2014-11-26 DIAGNOSIS — R509 Fever, unspecified: Secondary | ICD-10-CM | POA: Insufficient documentation

## 2014-11-26 DIAGNOSIS — Z79899 Other long term (current) drug therapy: Secondary | ICD-10-CM | POA: Insufficient documentation

## 2014-11-26 DIAGNOSIS — Y998 Other external cause status: Secondary | ICD-10-CM | POA: Insufficient documentation

## 2014-11-26 DIAGNOSIS — S39012A Strain of muscle, fascia and tendon of lower back, initial encounter: Secondary | ICD-10-CM | POA: Insufficient documentation

## 2014-11-26 DIAGNOSIS — R1110 Vomiting, unspecified: Secondary | ICD-10-CM | POA: Insufficient documentation

## 2014-11-26 DIAGNOSIS — M791 Myalgia: Secondary | ICD-10-CM | POA: Insufficient documentation

## 2014-11-26 DIAGNOSIS — Y9389 Activity, other specified: Secondary | ICD-10-CM | POA: Insufficient documentation

## 2014-11-26 MED ORDER — DM-GUAIFENESIN ER 30-600 MG PO TB12: 1.0000 | ORAL_TABLET | Freq: Two times a day (BID) | ORAL | Status: DC

## 2014-11-26 MED ORDER — DIAZEPAM 5 MG PO TABS
5.0000 mg | ORAL_TABLET | Freq: Two times a day (BID) | ORAL | Status: DC
Start: 2014-11-26 — End: 2015-05-21

## 2014-11-26 MED ORDER — IBUPROFEN 600 MG PO TABS
600.0000 mg | ORAL_TABLET | Freq: Four times a day (QID) | ORAL | Status: DC | PRN
Start: 2014-11-26 — End: 2015-05-21

## 2014-11-26 MED ORDER — ONDANSETRON HCL 4 MG PO TABS: 4.0000 mg | ORAL_TABLET | Freq: Four times a day (QID) | ORAL | Status: DC

## 2014-11-26 NOTE — ED Provider Notes (Signed)
CSN: [id number]     Arrival date & time 11/26/14  1826 History  This chart was scribed for non-physician practitioner, Junius Finner, PA-C working with Suzi Roots, MD by Gwenyth Ober, ED scribe. This patient was seen in room WTR5/WTR5 and the patient's care was started at 7:42 PM   Chief Complaint  Patient presents with  . back spasms   . Generalized Body Aches   The history is provided by the patient. No language interpreter was used.    HPI Comments: Jack Coleman is a 39 y.o. male who presents to the Emergency Department complaining of constant, generalized body aches and congestion that started 2 days ago. Pt states fever of 101.2 and vomiting that occurred yesterday as associated symptoms. He has tried Tylenol with no relief to symptoms. Pt denies sick contact. He has not had his flu shot this year. He denies diarrhea as an associated symptom.  Pt also complains of constant back spasms that started after he pulled a muscle during work yesterday. He states he was lifting a water heater when the injury occurred.  Reports taking valium in the past for muscle spasms which has helped. Denies change in bowel or bladder habits. No numbness or tingling in arms or legs.  History reviewed. No pertinent past medical history. Past Surgical History  Procedure Laterality Date  . Wrist surgery     No family history on file. History  Substance Use Topics  . Smoking status: Current Every Day Smoker -- 1.00 packs/day    Types: Cigarettes  . Smokeless tobacco: Never Used  . Alcohol Use: No    Review of Systems  Constitutional: Positive for fever.  HENT: Positive for congestion.   Gastrointestinal: Positive for vomiting. Negative for diarrhea.  Musculoskeletal: Positive for myalgias and back pain.  Skin: Negative for wound.  All other systems reviewed and are negative.  Allergies  Darvocet; Morphine and related; Ibuprofen; and Ultram  Home Medications   Prior to Admission medications    Medication Sig Start Date End Date Taking? Authorizing Provider  acetaminophen (TYLENOL) 500 MG tablet Take 1,000 mg by mouth every 6 (six) hours as needed for moderate pain. Patient used this medication for pain.   Yes Historical Provider, MD  dextromethorphan-guaiFENesin (MUCINEX DM) 30-600 MG per 12 hr tablet Take 1 tablet by mouth 2 (two) times daily. Be sure to take with 1 large glass of water 11/26/14   Junius Finner, PA-C  diazepam (VALIUM) 5 MG tablet Take 1 tablet (5 mg total) by mouth 2 (two) times daily. 11/26/14   Junius Finner, PA-C  ibuprofen (ADVIL,MOTRIN) 600 MG tablet Take 1 tablet (600 mg total) by mouth every 6 (six) hours as needed. 11/26/14   Junius Finner, PA-C  ondansetron (ZOFRAN) 4 MG tablet Take 1 tablet (4 mg total) by mouth every 6 (six) hours. 11/26/14   Junius Finner, PA-C  tamsulosin (FLOMAX) 0.4 MG CAPS Take 1 capsule (0.4 mg total) by mouth daily. Patient not taking: Reported on 11/26/2014 03/28/13   Teressa Lower, NP   BP 123/72 mmHg  Pulse 72  Temp(Src) 97.7 F (36.5 C) (Oral)  Resp 18  Ht  (1.854 m)  Wt 214 lb (97.07 kg)  BMI 28.24 kg/m2  SpO2 98% Physical Exam  Constitutional: He appears well-developed and well-nourished. No distress.  General impression appears acutely ill; mildly fatigued  HENT:  Head: Normocephalic and atraumatic.  Nasal mucosa edema   Eyes: Conjunctivae and EOM are normal.  Neck: Neck supple. No  tracheal deviation present.  Cardiovascular: Normal rate, regular rhythm and normal heart sounds.   Pulmonary/Chest: Effort normal and breath sounds normal. No respiratory distress.  Musculoskeletal: Normal range of motion. He exhibits tenderness.  Left lower lumbar muscle tenderness with palpable muscle spasm; no midline tenderness  Skin: Skin is warm and dry.  Psychiatric: He has a normal mood and affect. His behavior is normal.  Nursing note and vitals reviewed.   ED Course  Procedures (including critical care  time) DIAGNOSTIC STUDIES: Oxygen Saturation is 98% on RA, normal by my interpretation.    COORDINATION OF CARE: 7:45 PM Discussed treatment plan with pt at bedside and pt agreed to plan.   Labs Review Labs Reviewed - No data to display  Imaging Review No results found.   EKG Interpretation None      MDM   Final diagnoses:  Flu-like symptoms  Back strain, initial encounter    Pt is a 39yo male presenting to ED with flu-like symptoms. Doubt meningitis. Pt also c/o left lower back muscle spasm. Spasm palpable on exam. No red flag symptoms. Doubt cauda equina. Doubt renal stones. Will tx symptomatically, no imaging indicated at this time. Advised to f/u with PCP in 1 week if not improving Return precautions provided. Pt verbalized understanding and agreement with tx plan.   I personally performed the services described in this documentation, which was scribed in my presence. The recorded information has been reviewed and is accurate.    Junius Finnerrin O'Malley, PA-C 11/26/14 1953  Suzi RootsKevin E Steinl, MD 11/29/14 [phone]

## 2014-11-26 NOTE — ED Notes (Signed)
Pt c/o body aches, sinus and head cold for 2 days.  Pt states that he having back spasms after lifting a water heater yesterday.

## 2014-12-27 ENCOUNTER — Emergency Department (HOSPITAL_BASED_OUTPATIENT_CLINIC_OR_DEPARTMENT_OTHER)
Admission: EM | Admit: 2014-12-27 | Discharge: 2014-12-27 | Disposition: A | Discharge status: Home / Self Care | Payer: Self-pay | Attending: Emergency Medicine | Admitting: Emergency Medicine

## 2014-12-27 ENCOUNTER — Encounter (HOSPITAL_BASED_OUTPATIENT_CLINIC_OR_DEPARTMENT_OTHER): Payer: Self-pay | Admitting: *Deleted

## 2014-12-27 ENCOUNTER — Emergency Department (HOSPITAL_BASED_OUTPATIENT_CLINIC_OR_DEPARTMENT_OTHER): Payer: Self-pay

## 2014-12-27 DIAGNOSIS — Z79899 Other long term (current) drug therapy: Secondary | ICD-10-CM | POA: Insufficient documentation

## 2014-12-27 DIAGNOSIS — Z87442 Personal history of urinary calculi: Secondary | ICD-10-CM | POA: Insufficient documentation

## 2014-12-27 DIAGNOSIS — K5900 Constipation, unspecified: Secondary | ICD-10-CM | POA: Insufficient documentation

## 2014-12-27 DIAGNOSIS — Z720 Tobacco use: Secondary | ICD-10-CM | POA: Insufficient documentation

## 2014-12-27 DIAGNOSIS — Z87448 Personal history of other diseases of urinary system: Secondary | ICD-10-CM | POA: Insufficient documentation

## 2014-12-27 DIAGNOSIS — R109 Unspecified abdominal pain: Secondary | ICD-10-CM | POA: Insufficient documentation

## 2014-12-27 HISTORY — DX: Disorder of kidney and ureter, unspecified: N28.9

## 2014-12-27 HISTORY — DX: Calculus of kidney: N20.0

## 2014-12-27 LAB — BASIC METABOLIC PANEL
ANION GAP: 9 (ref 5–15)
BUN: 11 mg/dL (ref 6–23)
CO2: 28 mmol/L (ref 19–32)
Calcium: 8.6 mg/dL (ref 8.4–10.5)
Chloride: 101 mmol/L (ref 96–112)
Creatinine, Ser: 0.78 mg/dL (ref 0.50–1.35)
GLUCOSE: 136 mg/dL — AB (ref 70–99)
Potassium: 3.1 mmol/L — ABNORMAL LOW (ref 3.5–5.1)
SODIUM: 138 mmol/L (ref 135–145)

## 2014-12-27 LAB — URINALYSIS, ROUTINE W REFLEX MICROSCOPIC
Glucose, UA: NEGATIVE mg/dL
HGB URINE DIPSTICK: NEGATIVE
Ketones, ur: NEGATIVE mg/dL
LEUKOCYTES UA: NEGATIVE
Nitrite: NEGATIVE
PH: 5 (ref 5.0–8.0)
Protein, ur: NEGATIVE mg/dL
SPECIFIC GRAVITY, URINE: 1.027 (ref 1.005–1.030)
UROBILINOGEN UA: 1 mg/dL (ref 0.0–1.0)

## 2014-12-27 MED ORDER — DICYCLOMINE HCL 20 MG PO TABS: 20.0000 mg | ORAL_TABLET | Freq: Two times a day (BID) | ORAL | Status: DC

## 2014-12-27 MED ORDER — FENTANYL CITRATE 0.05 MG/ML IJ SOLN
100.0000 ug | Freq: Once | INTRAMUSCULAR | Status: AC
  Administered 2014-12-27: 100 ug via INTRAVENOUS
  Filled 2014-12-27: qty 2

## 2014-12-27 MED ORDER — ONDANSETRON 4 MG PO TBDP: 4.0000 mg | ORAL_TABLET | Freq: Three times a day (TID) | ORAL | Status: DC | PRN

## 2014-12-27 MED ORDER — FENTANYL CITRATE 0.05 MG/ML IJ SOLN
100.0000 ug | Freq: Once | INTRAMUSCULAR | Status: DC
Start: 2014-12-27 — End: 2014-12-27

## 2014-12-27 MED ORDER — POLYETHYLENE GLYCOL 3350 17 GM/SCOOP PO POWD: 17.0000 g | Freq: Two times a day (BID) | ORAL | Status: DC

## 2014-12-27 MED ORDER — ONDANSETRON HCL 4 MG/2ML IJ SOLN
4.0000 mg | Freq: Once | INTRAMUSCULAR | Status: AC
  Administered 2014-12-27: 4 mg via INTRAVENOUS
  Filled 2014-12-27: qty 2

## 2014-12-27 MED ORDER — POTASSIUM CHLORIDE CRYS ER 20 MEQ PO TBCR
40.0000 meq | EXTENDED_RELEASE_TABLET | Freq: Once | ORAL | Status: AC
  Administered 2014-12-27: 40 meq via ORAL
  Filled 2014-12-27: qty 2

## 2014-12-27 NOTE — Discharge Instructions (Signed)
Please follow up with your primary care physician in 1-2 days. If you do not have one please call the [facility] number listed above. Please read all discharge instructions and return precautions.    Abdominal Pain Many things can cause abdominal pain. Usually, abdominal pain is not caused by a disease and will improve without treatment. It can often be observed and treated at home. Your health care provider will do a physical exam and possibly order blood tests and X-rays to help determine the seriousness of your pain. However, in many cases, more time must pass before a clear cause of the pain can be found. Before that point, your health care provider may not know if you need more testing or further treatment. HOME CARE INSTRUCTIONS  Monitor your abdominal pain for any changes. The following actions may help to alleviate any discomfort you are experiencing:  Only take over-the-counter or prescription medicines as directed by your health care provider.  Do not take laxatives unless directed to do so by your health care provider.  Try a clear liquid diet (broth, tea, or water) as directed by your health care provider. Slowly move to a bland diet as tolerated. SEEK MEDICAL CARE IF:  You have unexplained abdominal pain.  You have abdominal pain associated with nausea or diarrhea.  You have pain when you urinate or have a bowel movement.  You experience abdominal pain that wakes you in the night.  You have abdominal pain that is worsened or improved by eating food.  You have abdominal pain that is worsened with eating fatty foods.  You have a fever. SEEK IMMEDIATE MEDICAL CARE IF:   Your pain does not go away within 2 hours.  You keep throwing up (vomiting).  Your pain is felt only in portions of the abdomen, such as the right side or the left lower portion of the abdomen.  You pass bloody or black tarry stools. MAKE SURE YOU:  Understand these instructions.    Will watch your condition.   Will get help right away if you are not doing well or get worse.  Document Released: 07/06/2005 Document Revised: 10/01/2013 Document Reviewed: 06/05/2013 [facility] Patient Information 2015 [place], [place]. This information is not intended to replace advice given to you by your health care provider. Make sure you discuss any questions you have with your health care provider. Constipation Constipation is when a person has fewer than three bowel movements a week, has difficulty having a bowel movement, or has stools that are dry, hard, or larger than normal. As people grow older, constipation is more common. If you try to fix constipation with medicines that make you have a bowel movement (laxatives), the problem may get worse. Long-term laxative use may cause the muscles of the colon to become weak. A low-fiber diet, not taking in enough fluids, and taking certain medicines may make constipation worse.  CAUSES   Certain medicines, such as antidepressants, pain medicine, iron supplements, antacids, and water pills.   Certain diseases, such as diabetes, irritable bowel syndrome (IBS), thyroid disease, or depression.   Not drinking enough water.   Not eating enough fiber-rich foods.   Stress or travel.   Lack of physical activity or exercise.   Ignoring the urge to have a bowel movement.   Using laxatives too much.  SIGNS AND SYMPTOMS   Having fewer than three bowel movements a week.   Straining to have a bowel movement.   Having stools that are  hard, dry, or larger than normal.   Feeling full or bloated.   Pain in the lower abdomen.   Not feeling relief after having a bowel movement.  DIAGNOSIS  Your health care provider will take a medical history and perform a physical exam. Further testing may be done for severe constipation. Some tests may include:  A barium enema X-ray to examine your rectum, colon, and, sometimes, your small  intestine.   A sigmoidoscopy to examine your lower colon.   A colonoscopy to examine your entire colon. TREATMENT  Treatment will depend on the severity of your constipation and what is causing it. Some dietary treatments include drinking more fluids and eating more fiber-rich foods. Lifestyle treatments may include regular exercise. If these diet and lifestyle recommendations do not help, your health care provider may recommend taking over-the-counter laxative medicines to help you have bowel movements. Prescription medicines may be prescribed if over-the-counter medicines do not work.  HOME CARE INSTRUCTIONS   Eat foods that have a lot of fiber, such as fruits, vegetables, whole grains, and beans.  Limit foods high in fat and processed sugars, such as french fries, hamburgers, cookies, candies, and soda.   A fiber supplement may be added to your diet if you cannot get enough fiber from foods.   Drink enough fluids to keep your urine clear or pale yellow.   Exercise regularly or as directed by your health care provider.   Go to the restroom when you have the urge to go. Do not hold it.   Only take over-the-counter or prescription medicines as directed by your health care provider. Do not take other medicines for constipation without talking to your health care provider first.  SEEK IMMEDIATE MEDICAL CARE IF:   You have bright red blood in your stool.   Your constipation lasts for more than 4 days or gets worse.   You have abdominal or rectal pain.   You have thin, pencil-like stools.   You have unexplained weight loss. MAKE SURE YOU:   Understand these instructions.  Will watch your condition.  Will get help right away if you are not doing well or get worse. Document Released: 06/24/2004 Document Revised: 10/01/2013 Document Reviewed: 07/08/2013 [facility] Patient Information 2015 [place]. This information is not intended to replace advice given to you by  your health care provider. Make sure you discuss any questions you have with your health care provider.

## 2014-12-27 NOTE — ED Provider Notes (Signed)
CSN: [id number]     Arrival date & time 12/27/14  1825 History   First MD Initiated Contact with Patient 12/27/14 1852     Chief Complaint  Patient presents with  . Nephrolithiasis     (Consider location/radiation/quality/duration/timing/severity/associated sxs/prior Treatment) HPI Comments: Patient is a 39 yo male past medical history significant for renal disorder, kidney stones presenting to the emergency department for left lower back pain with radiation into left lower abdomen has been getting progressively worse over the last 3 days. Describes it as sharp stabbing pain. Endorses associated nausea without vomiting. Also endorses associated decreased urine output. Denies fevers, diarrhea, hematuria. No abdominal surgical history.    Past Medical History  Diagnosis Date  . Renal disorder   . Kidney stones    Past Surgical History  Procedure Laterality Date  . Wrist surgery     No family history on file. History  Substance Use Topics  . Smoking status: Current Every Day Smoker -- 1.00 packs/day    Types: Cigarettes  . Smokeless tobacco: Never Used  . Alcohol Use: No    Review of Systems  Gastrointestinal: Positive for abdominal pain.  Genitourinary: Positive for flank pain and decreased urine volume.  Musculoskeletal: Positive for back pain.  All other systems reviewed and are negative.     Allergies  Darvocet; Morphine and related; Ibuprofen; and Ultram  Home Medications   Prior to Admission medications   Medication Sig Start Date End Date Taking? Authorizing Provider  diazepam (VALIUM) 5 MG tablet Take 1 tablet (5 mg total) by mouth 2 (two) times daily. 11/26/14  Yes Junius Finner, PA-C  acetaminophen (TYLENOL) 500 MG tablet Take 1,000 mg by mouth every 6 (six) hours as needed for moderate pain. Patient used this medication for pain.    Historical Provider, MD  dextromethorphan-guaiFENesin (MUCINEX DM) 30-600 MG per 12 hr tablet Take 1 tablet by mouth 2 (two) times  daily. Be sure to take with 1 large glass of water 11/26/14   Junius Finner, PA-C  dicyclomine (BENTYL) 20 MG tablet Take 1 tablet (20 mg total) by mouth 2 (two) times daily. 12/27/14   Siham Bucaro, PA-C  ibuprofen (ADVIL,MOTRIN) 600 MG tablet Take 1 tablet (600 mg total) by mouth every 6 (six) hours as needed. 11/26/14   Junius Finner, PA-C  ondansetron (ZOFRAN ODT) 4 MG disintegrating tablet Take 1 tablet (4 mg total) by mouth every 8 (eight) hours as needed for nausea. 12/27/14   Hayla Hinger, PA-C  ondansetron (ZOFRAN) 4 MG tablet Take 1 tablet (4 mg total) by mouth every 6 (six) hours. 11/26/14   Junius Finner, PA-C  polyethylene glycol powder (GLYCOLAX/MIRALAX) powder Take 17 g by mouth 2 (two) times daily. Until daily soft stools  OTC 12/27/14   Francee Piccolo, PA-C  tamsulosin (FLOMAX) 0.4 MG CAPS Take 1 capsule (0.4 mg total) by mouth daily. Patient not taking: Reported on 11/26/2014 03/28/13   Teressa Lower, NP   BP 116/57 mmHg  Pulse 67  Temp(Src) 97.8 F (36.6 C) (Oral)  Resp 17  Ht  (1.854 m)  Wt 219 lb 6 oz (99.508 kg)  BMI 28.95 kg/m2  SpO2 98% Physical Exam  Constitutional: He is oriented to person, place, and time. He appears well-developed and well-nourished. No distress.  HENT:  Head: Normocephalic and atraumatic.  Right Ear: External ear normal.  Left Ear: External ear normal.  Nose: Nose normal.  Mouth/Throat: Oropharynx is clear and moist. No oropharyngeal exudate.  Eyes: Conjunctivae are  normal.  Neck: Neck supple.  Cardiovascular: Normal rate, regular rhythm and normal heart sounds.   Pulmonary/Chest: Effort normal and breath sounds normal.  Abdominal: Soft. Bowel sounds are normal. He exhibits no distension. There is tenderness. There is no rigidity, no rebound and no guarding.    Musculoskeletal:       Back:  Neurological: He is alert and oriented to person, place, and time.  Skin: Skin is warm and dry. He is not diaphoretic.    Nursing note and vitals reviewed.   ED Course  Procedures (including critical care time) Medications  fentaNYL (SUBLIMAZE) injection 100 mcg (100 mcg Intravenous Given 12/27/14 2025)  ondansetron (ZOFRAN) injection 4 mg (4 mg Intravenous Given 12/27/14 2130)  potassium chloride SA (K-DUR,KLOR-CON) CR tablet 40 mEq (40 mEq Oral Given 12/27/14 2130)    Labs Review Labs Reviewed  URINALYSIS, ROUTINE W REFLEX MICROSCOPIC - Abnormal; Notable for the following:    Bilirubin Urine SMALL (*)    All other components within normal limits  BASIC METABOLIC PANEL - Abnormal; Notable for the following:    Potassium 3.1 (*)    Glucose, Bld 136 (*)    All other components within normal limits    Imaging Review Ct Renal Stone Study  12/27/2014   CLINICAL DATA:  Left lower abdominal pain, kidney stones  EXAM: CT ABDOMEN AND PELVIS WITHOUT CONTRAST  TECHNIQUE: Multidetector CT imaging of the abdomen and pelvis was performed following the standard protocol without IV contrast.  COMPARISON:  03/28/2013  FINDINGS: Lower chest:  Mild atelectasis at the lung bases.  Hepatobiliary: Unenhanced liver is unremarkable.  Gallbladder is unremarkable. No intrahepatic or extrahepatic ductal dilatation.  Pancreas: Within normal limits.  Spleen: At the upper limits of normal for size.  Adrenals/Urinary Tract: Adrenal glands are unremarkable.  Kidneys are within normal limits.  No renal, ureteral, or bladder calculi.  Bladder is within normal limits.  Stomach/Bowel: Stomach is within normal limits.  No evidence of bowel obstruction.  Normal appendix.  Moderate colonic stool burden, suggesting constipation.  Vascular/Lymphatic: No evidence of abdominal aortic aneurysm.  No suspicious abdominopelvic lymphadenopathy.  Reproductive: Prostate is unremarkable.  Other: No abdominopelvic ascites.  Small fat containing right inguinal hernia.  Left pelvic calcifications (series 2/images 88 and 93) are distinct from the left ureter and  reflect pelvic phleboliths.  Musculoskeletal: Visualized osseous structures are within normal limits.  IMPRESSION: No renal, ureteral, or bladder calculi.  No hydronephrosis.  No evidence of bowel obstruction.  Normal appendix.  Moderate colonic stool burden, suggesting constipation.   Electronically Signed   By: Charline Bills M.D.   On: 12/27/2014 21:10     EKG Interpretation None      MDM   Final diagnoses:  Abdominal pain in male  Constipation, unspecified constipation type    Filed Vitals:   12/27/14 2136  BP: 116/57  Pulse: 67  Temp:   Resp: 17   Afebrile, NAD, non-toxic appearing, AAOx4.  I have reviewed nursing notes, vital signs, and all appropriate lab and imaging results for this patient. Patient is nontoxic, nonseptic appearing, in no apparent distress.  Patient's pain and other symptoms adequately managed in emergency department.  Fluid bolus given.  Labs, imaging and vitals reviewed.  Patient does not meet the SIRS or Sepsis criteria.  On repeat exam patient does not have a surgical abdomin and there are no peritoneal signs.  No indication of appendicitis, bowel obstruction, bowel perforation, cholecystitis, diverticulitis, renal stone. Noted to be constipated.  Patient discharged home with symptomatic treatment and given strict instructions for follow-up with their primary care physician.  I have also discussed reasons to return immediately to the ER.  Patient expresses understanding and agrees with plan. Patient d/w with Dr. Manus Gunningancour, agrees with plan.         Francee PiccoloJennifer Pegi Milazzo, PA-C 12/28/14 [id number]  Glynn OctaveStephen Rancour, MD 12/28/14 1452

## 2014-12-27 NOTE — ED Notes (Signed)
C/o left lower abd pain and around to left back. Pt states he dx with 3 kidney stones and has not passed them. States pain has been present since Wednesday. C/o n/v.

## 2015-01-26 ENCOUNTER — Encounter (HOSPITAL_BASED_OUTPATIENT_CLINIC_OR_DEPARTMENT_OTHER): Payer: Self-pay | Admitting: *Deleted

## 2015-01-26 ENCOUNTER — Emergency Department (HOSPITAL_BASED_OUTPATIENT_CLINIC_OR_DEPARTMENT_OTHER)
Admission: EM | Admit: 2015-01-26 | Discharge: 2015-01-26 | Disposition: A | Discharge status: Home / Self Care | Payer: Self-pay | Attending: Emergency Medicine | Admitting: Emergency Medicine

## 2015-01-26 DIAGNOSIS — Z79899 Other long term (current) drug therapy: Secondary | ICD-10-CM | POA: Insufficient documentation

## 2015-01-26 DIAGNOSIS — Z87448 Personal history of other diseases of urinary system: Secondary | ICD-10-CM | POA: Insufficient documentation

## 2015-01-26 DIAGNOSIS — K029 Dental caries, unspecified: Secondary | ICD-10-CM | POA: Insufficient documentation

## 2015-01-26 DIAGNOSIS — K047 Periapical abscess without sinus: Secondary | ICD-10-CM | POA: Insufficient documentation

## 2015-01-26 DIAGNOSIS — Z720 Tobacco use: Secondary | ICD-10-CM | POA: Insufficient documentation

## 2015-01-26 DIAGNOSIS — Z87442 Personal history of urinary calculi: Secondary | ICD-10-CM | POA: Insufficient documentation

## 2015-01-26 DIAGNOSIS — K0889 Other specified disorders of teeth and supporting structures: Secondary | ICD-10-CM

## 2015-01-26 MED ORDER — PENICILLIN V POTASSIUM 500 MG PO TABS: 500.0000 mg | ORAL_TABLET | Freq: Four times a day (QID) | ORAL | Status: DC

## 2015-01-26 MED ORDER — HYDROCODONE-ACETAMINOPHEN 5-325 MG PO TABS: 1.0000 | ORAL_TABLET | ORAL | Status: DC | PRN

## 2015-01-26 NOTE — ED Notes (Signed)
Pt c/o dental pain x 3 days.

## 2015-01-26 NOTE — ED Provider Notes (Signed)
CSN: [id number]     Arrival date & time 01/26/15  1851 History   First MD Initiated Contact with Patient 01/26/15 2027     Chief Complaint  Patient presents with  . Dental Pain     (Consider location/radiation/quality/duration/timing/severity/associated sxs/prior Treatment) Patient is a 39 y.o. male presenting with tooth pain. The history is provided by the patient and medical records.  Dental Pain   39 year old male with history of kidney stones presenting to the ED for right lower dental pain for the past 3 days. He notes some associated swelling. No difficulty swallowing or speaking. No fever or chills. Patient does have a dentist but states he does not have the money to make an appointment at this time.  Patient has applied ice to the right side of his face with some improvement.  No meds taken.  Past Medical History  Diagnosis Date  . Renal disorder   . Kidney stones    Past Surgical History  Procedure Laterality Date  . Wrist surgery     History reviewed. No pertinent family history. History  Substance Use Topics  . Smoking status: Current Every Day Smoker -- 1.00 packs/day    Types: Cigarettes  . Smokeless tobacco: Never Used  . Alcohol Use: No    Review of Systems  HENT: Positive for dental problem.   All other systems reviewed and are negative.     Allergies  Darvocet; Morphine and related; Ibuprofen; and Ultram  Home Medications   Prior to Admission medications   Medication Sig Start Date End Date Taking? Authorizing Provider  acetaminophen (TYLENOL) 500 MG tablet Take 1,000 mg by mouth every 6 (six) hours as needed for moderate pain. Patient used this medication for pain.    Historical Provider, MD  dextromethorphan-guaiFENesin (MUCINEX DM) 30-600 MG per 12 hr tablet Take 1 tablet by mouth 2 (two) times daily. Be sure to take with 1 large glass of water 11/26/14   Junius FinnerErin O'Malley, PA-C  diazepam (VALIUM) 5 MG tablet Take 1 tablet (5 mg total) by mouth 2 (two)  times daily. 11/26/14   Junius FinnerErin O'Malley, PA-C  dicyclomine (BENTYL) 20 MG tablet Take 1 tablet (20 mg total) by mouth 2 (two) times daily. 12/27/14   Jennifer Piepenbrink, PA-C  ibuprofen (ADVIL,MOTRIN) 600 MG tablet Take 1 tablet (600 mg total) by mouth every 6 (six) hours as needed. 11/26/14   Junius FinnerErin O'Malley, PA-C  ondansetron (ZOFRAN ODT) 4 MG disintegrating tablet Take 1 tablet (4 mg total) by mouth every 8 (eight) hours as needed for nausea. 12/27/14   Jennifer Piepenbrink, PA-C  ondansetron (ZOFRAN) 4 MG tablet Take 1 tablet (4 mg total) by mouth every 6 (six) hours. 11/26/14   Junius FinnerErin O'Malley, PA-C  polyethylene glycol powder (GLYCOLAX/MIRALAX) powder Take 17 g by mouth 2 (two) times daily. Until daily soft stools  OTC 12/27/14   Francee PiccoloJennifer Piepenbrink, PA-C  tamsulosin (FLOMAX) 0.4 MG CAPS Take 1 capsule (0.4 mg total) by mouth daily. Patient not taking: Reported on 11/26/2014 03/28/13   Teressa LowerVrinda Pickering, NP   BP 126/84 mmHg  Pulse 63  Temp(Src) 98.3 F (36.8 C)  Resp 16  Ht 6\' 1"  (1.854 m)  Wt 219 lb (99.338 kg)  BMI 28.90 kg/m2  SpO2 97%   Physical Exam  Constitutional: He is oriented to person, place, and time. He appears well-developed and well-nourished. No distress.  HENT:  Head: Normocephalic and atraumatic.  Mouth/Throat: Uvula is midline, oropharynx is clear and moist and mucous membranes are normal.  Abnormal dentition. Dental caries present. No oropharyngeal exudate, posterior oropharyngeal edema, posterior oropharyngeal erythema or tonsillar abscesses.  Teeth largely in poor dentition, right lower premolar broken and nearly flush with gum-line; large cavity present, surrounding gingiva swollen without appreciable fluid collection/abscess formation, handling secretions appropriately, no trismus  Eyes: Conjunctivae and EOM are normal. Pupils are equal, round, and reactive to light.  Neck: Normal range of motion. Neck supple.  Cardiovascular: Normal rate, regular rhythm and normal heart  sounds.   Pulmonary/Chest: Effort normal and breath sounds normal. No respiratory distress. He has no wheezes.  Musculoskeletal: Normal range of motion.  Neurological: He is alert and oriented to person, place, and time.  Skin: Skin is warm and dry. He is not diaphoretic.  Psychiatric: He has a normal mood and affect.  Nursing note and vitals reviewed.   ED Course  Procedures (including critical care time) Labs Review Labs Reviewed - No data to display  Imaging Review No results found.   EKG Interpretation None      MDM   Final diagnoses:  Pain, dental  Dental infection   Dental pain with concern for developing dental abscess. No neck swelling to suggest Ludwig angina. Patient handling secretions well, no difficulty swallowing or speaking. Patient will be started on antibiotics and short course of pain medication. He is to follow-up with his dentist this week.  Discussed plan with patient, he/she acknowledged understanding and agreed with plan of care.  Return precautions given for new or worsening symptoms.   Garlon Hatchet, PA-C 01/26/15 2142  Nelva Nay, MD 01/31/15 1520

## 2015-01-26 NOTE — Discharge Instructions (Signed)
Take the prescribed medication as directed. °Follow-up with your dentist. °Return to the ED for new or worsening symptoms. ° °

## 2015-02-02 ENCOUNTER — Encounter (HOSPITAL_COMMUNITY): Payer: Self-pay | Admitting: *Deleted

## 2015-02-02 ENCOUNTER — Encounter (HOSPITAL_COMMUNITY): Payer: Self-pay | Admitting: Emergency Medicine

## 2015-02-02 ENCOUNTER — Emergency Department (HOSPITAL_COMMUNITY)
Admission: EM | Admit: 2015-02-02 | Discharge: 2015-02-03 | Disposition: A | Discharge status: Home / Self Care | Payer: Self-pay | Attending: Emergency Medicine | Admitting: Emergency Medicine

## 2015-02-02 ENCOUNTER — Emergency Department (HOSPITAL_COMMUNITY)
Admission: EM | Admit: 2015-02-02 | Discharge: 2015-02-02 | Discharge status: Against Medical Advice | Payer: Self-pay | Attending: Emergency Medicine | Admitting: Emergency Medicine

## 2015-02-02 DIAGNOSIS — Z720 Tobacco use: Secondary | ICD-10-CM | POA: Insufficient documentation

## 2015-02-02 DIAGNOSIS — Z79899 Other long term (current) drug therapy: Secondary | ICD-10-CM | POA: Insufficient documentation

## 2015-02-02 DIAGNOSIS — Z792 Long term (current) use of antibiotics: Secondary | ICD-10-CM | POA: Insufficient documentation

## 2015-02-02 DIAGNOSIS — R059 Cough, unspecified: Secondary | ICD-10-CM

## 2015-02-02 DIAGNOSIS — M25562 Pain in left knee: Secondary | ICD-10-CM | POA: Insufficient documentation

## 2015-02-02 DIAGNOSIS — Y998 Other external cause status: Secondary | ICD-10-CM | POA: Insufficient documentation

## 2015-02-02 DIAGNOSIS — K0889 Other specified disorders of teeth and supporting structures: Secondary | ICD-10-CM

## 2015-02-02 DIAGNOSIS — Y9289 Other specified places as the place of occurrence of the external cause: Secondary | ICD-10-CM | POA: Insufficient documentation

## 2015-02-02 DIAGNOSIS — Y9389 Activity, other specified: Secondary | ICD-10-CM | POA: Insufficient documentation

## 2015-02-02 DIAGNOSIS — Z87448 Personal history of other diseases of urinary system: Secondary | ICD-10-CM | POA: Insufficient documentation

## 2015-02-02 DIAGNOSIS — K088 Other specified disorders of teeth and supporting structures: Secondary | ICD-10-CM | POA: Insufficient documentation

## 2015-02-02 DIAGNOSIS — S8992XA Unspecified injury of left lower leg, initial encounter: Secondary | ICD-10-CM | POA: Insufficient documentation

## 2015-02-02 DIAGNOSIS — M25569 Pain in unspecified knee: Secondary | ICD-10-CM

## 2015-02-02 DIAGNOSIS — Z87442 Personal history of urinary calculi: Secondary | ICD-10-CM | POA: Insufficient documentation

## 2015-02-02 DIAGNOSIS — R05 Cough: Secondary | ICD-10-CM | POA: Insufficient documentation

## 2015-02-02 DIAGNOSIS — X58XXXA Exposure to other specified factors, initial encounter: Secondary | ICD-10-CM | POA: Insufficient documentation

## 2015-02-02 NOTE — ED Notes (Signed)
Pt states he is having pain and swelling to his left knee   Pt states a knee support noted  Pt states the pain is on the inside of his knee   Pt states sxs started about 2 weeks ago

## 2015-02-02 NOTE — ED Notes (Signed)
Pt was called for room assignment, no response.  will call again for room

## 2015-02-02 NOTE — ED Notes (Signed)
Pt reports L knee swelling and pain x 1 week.  Knee sleeve noted.  Denies injury.  Pt reports he was picking up his nephew and turned and heard a pop x 3 months ago.

## 2015-02-03 ENCOUNTER — Emergency Department (HOSPITAL_COMMUNITY): Payer: Self-pay

## 2015-02-03 MED ORDER — KETOROLAC TROMETHAMINE 60 MG/2ML IM SOLN
30.0000 mg | Freq: Once | INTRAMUSCULAR | Status: AC
  Administered 2015-02-03: 30 mg via INTRAMUSCULAR
  Filled 2015-02-03: qty 2

## 2015-02-03 MED ORDER — BUPIVACAINE-EPINEPHRINE (PF) 0.5% -1:200000 IJ SOLN
1.8000 mL | Freq: Once | INTRAMUSCULAR | Status: AC
  Administered 2015-02-03: 1.8 mL
  Filled 2015-02-03: qty 1.8

## 2015-02-03 NOTE — Discharge Instructions (Signed)
Arthralgia °Your caregiver has diagnosed you as suffering from an arthralgia. Arthralgia means there is pain in a joint. This can come from many reasons including: °· Bruising the joint which causes soreness (inflammation) in the joint. °· Wear and tear on the joints which occur as we grow older (osteoarthritis). °· Overusing the joint. °· Various forms of arthritis. °· Infections of the joint. °Regardless of the cause of pain in your joint, most of these different pains respond to anti-inflammatory drugs and rest. The exception to this is when a joint is infected, and these cases are treated with antibiotics, if it is a bacterial infection. °HOME CARE INSTRUCTIONS  °· Rest the injured area for as long as directed by your caregiver. Then slowly start using the joint as directed by your caregiver and as the pain allows. Crutches as directed may be useful if the ankles, knees or hips are involved. If the knee was splinted or casted, continue use and care as directed. If an stretchy or elastic wrapping bandage has been applied today, it should be removed and re-applied every 3 to 4 hours. It should not be applied tightly, but firmly enough to keep swelling down. Watch toes and feet for swelling, bluish discoloration, coldness, numbness or excessive pain. If any of these problems (symptoms) occur, remove the ace bandage and re-apply more loosely. If these symptoms persist, contact your caregiver or return to this location. °· For the first 24 hours, keep the injured extremity elevated on pillows while lying down. °· Apply ice for 15-20 minutes to the sore joint every couple hours while awake for the first half day. Then 03-04 times per day for the first 48 hours. Put the ice in a plastic bag and place a towel between the bag of ice and your skin. °· Wear any splinting, casting, elastic bandage applications, or slings as instructed. °· Only take over-the-counter or prescription medicines for pain, discomfort, or fever as  directed by your caregiver. Do not use aspirin immediately after the injury unless instructed by your physician. Aspirin can cause increased bleeding and bruising of the tissues. °· If you were given crutches, continue to use them as instructed and do not resume weight bearing on the sore joint until instructed. °Persistent pain and inability to use the sore joint as directed for more than 2 to 3 days are warning signs indicating that you should see a caregiver for a follow-up visit as soon as possible. Initially, a hairline fracture (break in bone) may not be evident on X-rays. Persistent pain and swelling indicate that further evaluation, non-weight bearing or use of the joint (use of crutches or slings as instructed), or further X-rays are indicated. X-rays may sometimes not show a small fracture until a week or 10 days later. Make a follow-up appointment with your own caregiver or one to whom we have referred you. A radiologist (specialist in reading X-rays) may read your X-rays. Make sure you know how you are to obtain your X-ray results. Do not assume everything is normal if you do not hear from us. °SEEK MEDICAL CARE IF: °Bruising, swelling, or pain increases. °SEEK IMMEDIATE MEDICAL CARE IF:  °· Your fingers or toes are numb or blue. °· The pain is not responding to medications and continues to stay the same or get worse. °· The pain in your joint becomes severe. °· You develop a fever over 102° F (38.9° C). °· It becomes impossible to move or use the joint. °MAKE SURE YOU:  °·   Understand these instructions.  Will watch your condition.  Will get help right away if you are not doing well or get worse. Document Released: 09/26/2005 Document Revised: 12/19/2011 Document Reviewed: 05/14/2008 [facility] Patient Information 2015 [place], [place]. This information is not intended to replace advice given to you by your health care provider. Make sure you discuss any questions you have with your health care  provider. Dental Pain A tooth ache may be caused by cavities (tooth decay). Cavities expose the nerve of the tooth to air and hot or cold temperatures. It may come from an infection or abscess (also called a boil or furuncle) around your tooth. It is also often caused by dental caries (tooth decay). This causes the pain you are having. DIAGNOSIS  Your caregiver can diagnose this problem by exam. TREATMENT   If caused by an infection, it may be treated with medications which kill germs (antibiotics) and pain medications as prescribed by your caregiver. Take medications as directed.  Only take over-the-counter or prescription medicines for pain, discomfort, or fever as directed by your caregiver.  Whether the tooth ache today is caused by infection or dental disease, you should see your dentist as soon as possible for further care. SEEK MEDICAL CARE IF: The exam and treatment you received today has been provided on an emergency basis only. This is not a substitute for complete medical or dental care. If your problem worsens or new problems (symptoms) appear, and you are unable to meet with your dentist, call or return to this location. SEEK IMMEDIATE MEDICAL CARE IF:   You have a fever.  You develop redness and swelling of your face, jaw, or neck.  You are unable to open your mouth.  You have severe pain uncontrolled by pain medicine. MAKE SURE YOU:   Understand these instructions.  Will watch your condition.  Will get help right away if you are not doing well or get worse. Document Released: 09/26/2005 Document Revised: 12/19/2011 Document Reviewed: 05/14/2008 [facility] Patient Information 2015 [place], [place]. This information is not intended to replace advice given to you by your health care provider. Make sure you discuss any questions you have with your health care provider. Upper Respiratory Infection, Adult An upper respiratory infection (URI) is also sometimes known as the  common cold. The upper respiratory tract includes the nose, sinuses, throat, trachea, and bronchi. Bronchi are the airways leading to the lungs. Most people improve within 1 week, but symptoms can last up to 2 weeks. A residual cough may last even longer.  CAUSES Many different viruses can infect the tissues lining the upper respiratory tract. The tissues become irritated and inflamed and often become very moist. Mucus production is also common. A cold is contagious. You can easily spread the virus to others by oral contact. This includes kissing, sharing a glass, coughing, or sneezing. Touching your mouth or nose and then touching a surface, which is then touched by another person, can also spread the virus. SYMPTOMS  Symptoms typically develop 1 to 3 days after you come in contact with a cold virus. Symptoms vary from person to person. They may include:  Runny nose.  Sneezing.  Nasal congestion.  Sinus irritation.  Sore throat.  Loss of voice (laryngitis).  Cough.  Fatigue.  Muscle aches.  Loss of appetite.  Headache.  Low-grade fever. DIAGNOSIS  You might diagnose your own cold based on familiar symptoms, since most people get a cold 2 to 3 times a year. Your caregiver can  confirm this based on your exam. Most importantly, your caregiver can check that your symptoms are not due to another disease such as strep throat, sinusitis, pneumonia, asthma, or epiglottitis. Blood tests, throat tests, and X-rays are not necessary to diagnose a common cold, but they may sometimes be helpful in excluding other more serious diseases. Your caregiver will decide if any further tests are required. RISKS AND COMPLICATIONS  You may be at risk for a more severe case of the common cold if you smoke cigarettes, have chronic heart disease (such as heart failure) or lung disease (such as asthma), or if you have a weakened immune system. The very young and very old are also at risk for more serious  infections. Bacterial sinusitis, middle ear infections, and bacterial pneumonia can complicate the common cold. The common cold can worsen asthma and chronic obstructive pulmonary disease (COPD). Sometimes, these complications can require emergency medical care and may be life-threatening. PREVENTION  The best way to protect against getting a cold is to practice good hygiene. Avoid oral or hand contact with people with cold symptoms. Wash your hands often if contact occurs. There is no clear evidence that vitamin C, vitamin E, echinacea, or exercise reduces the chance of developing a cold. However, it is always recommended to get plenty of rest and practice good nutrition. TREATMENT  Treatment is directed at relieving symptoms. There is no cure. Antibiotics are not effective, because the infection is caused by a virus, not by bacteria. Treatment may include:  Increased fluid intake. Sports drinks offer valuable electrolytes, sugars, and fluids.  Breathing heated mist or steam (vaporizer or shower).  Eating chicken soup or other clear broths, and maintaining good nutrition.  Getting plenty of rest.  Using gargles or lozenges for comfort.  Controlling fevers with ibuprofen or acetaminophen as directed by your caregiver.  Increasing usage of your inhaler if you have asthma. Zinc gel and zinc lozenges, taken in the first 24 hours of the common cold, can shorten the duration and lessen the severity of symptoms. Pain medicines may help with fever, muscle aches, and throat pain. A variety of non-prescription medicines are available to treat congestion and runny nose. Your caregiver can make recommendations and may suggest nasal or lung inhalers for other symptoms.  HOME CARE INSTRUCTIONS   Only take over-the-counter or prescription medicines for pain, discomfort, or fever as directed by your caregiver.  Use a warm mist humidifier or inhale steam from a shower to increase air moisture. This may keep  secretions moist and make it easier to breathe.  Drink enough water and fluids to keep your urine clear or pale yellow.  Rest as needed.  Return to work when your temperature has returned to normal or as your caregiver advises. You may need to stay home longer to avoid infecting others. You can also use a face mask and careful hand washing to prevent spread of the virus. SEEK MEDICAL CARE IF:   After the first few days, you feel you are getting worse rather than better.  You need your caregiver's advice about medicines to control symptoms.  You develop chills, worsening shortness of breath, or brown or red sputum. These may be signs of pneumonia.  You develop yellow or brown nasal discharge or pain in the face, especially when you bend forward. These may be signs of sinusitis.  You develop a fever, swollen neck glands, pain with swallowing, or white areas in the back of your throat. These may be signs  of strep throat. SEEK IMMEDIATE MEDICAL CARE IF:   You have a fever.  You develop severe or persistent headache, ear pain, sinus pain, or chest pain.  You develop wheezing, a prolonged cough, cough up blood, or have a change in your usual mucus (if you have chronic lung disease).  You develop sore muscles or a stiff neck. Document Released: 03/22/2001 Document Revised: 12/19/2011 Document Reviewed: 01/01/2014 [facility] Patient Information 2015 [place], [place]. This information is not intended to replace advice given to you by your health care provider. Make sure you discuss any questions you have with your health care provider.

## 2015-02-03 NOTE — ED Notes (Signed)
Pt alert x4 respirations easy non labored.  

## 2015-02-03 NOTE — ED Provider Notes (Signed)
CSN: [id number]     Arrival date & time 02/02/15  2326 History   First MD Initiated Contact with Patient 02/03/15 0146     Chief Complaint  Patient presents with  . Knee Pain     (Consider location/radiation/quality/duration/timing/severity/associated sxs/prior Treatment) Patient is a 39 y.o. male presenting with knee pain, tooth pain, and cough.  Knee Pain Location:  Knee Time since incident:  2 weeks Injury: no   Knee location:  L knee Pain details:    Quality:  Aching   Radiates to:  Does not radiate   Severity:  Severe   Onset quality:  Gradual   Duration:  2 weeks   Timing:  Constant   Progression:  Worsening Chronicity:  Recurrent Relieved by:  Nothing Worsened by:  Bearing weight, flexion, extension and exercise Associated symptoms: decreased ROM (2/2 pain) and stiffness   Associated symptoms: no back pain, no fever, no neck pain, no numbness and no swelling   Dental Pain Location:  Lower Lower teeth location:  31/RL 2nd molar Quality:  Dull Severity:  Severe Onset quality:  Gradual Timing:  Constant Chronicity:  Chronic Context comment:  Visit for same 8 days ago Relieved by:  Nothing Worsened by:  Hot food/drink, touching and cold food/drink Associated symptoms: no fever and no neck pain   Cough Cough characteristics:  Productive Sputum characteristics:  Green Severity:  Moderate Onset quality:  Gradual Duration:  3 days Timing:  Constant Progression:  Unchanged Chronicity:  New Smoker: yes   Context: upper respiratory infection   Relieved by:  Nothing Worsened by:  Nothing tried Ineffective treatments:  None tried Associated symptoms: no chills, no fever, no shortness of breath and no wheezing     Past Medical History  Diagnosis Date  . Renal disorder   . Kidney stones    Past Surgical History  Procedure Laterality Date  . Wrist surgery     History reviewed. No pertinent family history. History  Substance Use Topics  . Smoking status:  Current Every Day Smoker -- 1.00 packs/day    Types: Cigarettes  . Smokeless tobacco: Never Used  . Alcohol Use: No    Review of Systems  Constitutional: Negative for fever and chills.  Respiratory: Positive for cough. Negative for shortness of breath and wheezing.   Musculoskeletal: Positive for stiffness. Negative for back pain and neck pain.  All other systems reviewed and are negative.     Allergies  Darvocet; Morphine and related; Ibuprofen; and Ultram  Home Medications   Prior to Admission medications   Medication Sig Start Date End Date Taking? Authorizing Provider  acetaminophen (TYLENOL) 500 MG tablet Take 1,000 mg by mouth every 6 (six) hours as needed for moderate pain. Patient used this medication for pain.   Yes Historical Provider, MD  diazepam (VALIUM) 5 MG tablet Take 1 tablet (5 mg total) by mouth 2 (two) times daily. 11/26/14  Yes Junius Finner, PA-C  dicyclomine (BENTYL) 20 MG tablet Take 1 tablet (20 mg total) by mouth 2 (two) times daily. 12/27/14  Yes Jennifer Piepenbrink, PA-C  HYDROcodone-acetaminophen (NORCO/VICODIN) 5-325 MG per tablet Take 1 tablet by mouth every 4 (four) hours as needed. 01/26/15  Yes Garlon Hatchet, PA-C  polyethylene glycol powder (GLYCOLAX/MIRALAX) powder Take 17 g by mouth 2 (two) times daily. Until daily soft stools  OTC 12/27/14  Yes Jennifer Piepenbrink, PA-C  dextromethorphan-guaiFENesin (MUCINEX DM) 30-600 MG per 12 hr tablet Take 1 tablet by mouth 2 (two) times daily. Be  sure to take with 1 large glass of water Patient not taking: Reported on 02/03/2015 11/26/14   Junius Finner, PA-C  ibuprofen (ADVIL,MOTRIN) 600 MG tablet Take 1 tablet (600 mg total) by mouth every 6 (six) hours as needed. Patient taking differently: Take 600 mg by mouth every 6 (six) hours as needed for moderate pain.  11/26/14   Junius Finner, PA-C  ondansetron (ZOFRAN ODT) 4 MG disintegrating tablet Take 1 tablet (4 mg total) by mouth every 8 (eight) hours as needed  for nausea. 12/27/14   Jennifer Piepenbrink, PA-C  ondansetron (ZOFRAN) 4 MG tablet Take 1 tablet (4 mg total) by mouth every 6 (six) hours. Patient not taking: Reported on 02/03/2015 11/26/14   Junius Finner, PA-C  penicillin v potassium (VEETID) 500 MG tablet Take 1 tablet (500 mg total) by mouth 4 (four) times daily. Patient not taking: Reported on 02/03/2015 01/26/15   Garlon Hatchet, PA-C  tamsulosin (FLOMAX) 0.4 MG CAPS Take 1 capsule (0.4 mg total) by mouth daily. Patient not taking: Reported on 11/26/2014 03/28/13   Teressa Lower, NP   BP 101/73 mmHg  Pulse 93  Temp(Src) 97.5 F (36.4 C) (Oral)  Resp 18  SpO2 97% Physical Exam  Constitutional: He is oriented to person, place, and time. He appears well-developed and well-nourished.  HENT:  Head: Normocephalic and atraumatic.  Eyes: Conjunctivae and EOM are normal.  Neck: Normal range of motion. Neck supple.  Cardiovascular: Normal rate, regular rhythm and normal heart sounds.   Pulmonary/Chest: Effort normal and breath sounds normal. No respiratory distress.  Abdominal: He exhibits no distension. There is no tenderness. There is no rebound and no guarding.  Musculoskeletal: Normal range of motion.       Left knee: He exhibits normal range of motion, no swelling, no effusion, no ecchymosis, no deformity, no erythema, normal alignment, no LCL laxity, normal patellar mobility, normal meniscus and no MCL laxity. Tenderness found. Medial joint line tenderness noted.  Neurological: He is alert and oriented to person, place, and time.  Skin: Skin is warm and dry.  Vitals reviewed.   ED Course  NERVE BLOCK Date/Time: 02/03/2015 2:13 AM Performed by: Mirian Mo Authorized by: Mirian Mo Consent: Verbal consent obtained. Body area: face/mouth Nerve: inferior alveolar Laterality: right Patient sedated: no Patient position: sitting Needle gauge: 25 G Location technique: anatomical landmarks Local anesthetic: bupivacaine  0.5% without epinephrine Outcome: pain improved Patient tolerance: Patient tolerated the procedure well with no immediate complications   (including critical care time) Labs Review Labs Reviewed - No data to display  Imaging Review Dg Chest 2 View  02/03/2015   CLINICAL DATA:  39 year old male with cough  EXAM: CHEST  2 VIEW  COMPARISON:  Prior chest x-ray 09/04/2010  FINDINGS: The lungs are clear and negative for focal airspace consolidation, pulmonary edema or suspicious pulmonary nodule. No pleural effusion or pneumothorax. Cardiac and mediastinal contours are within normal limits. No acute fracture or lytic or blastic osseous lesions. The visualized upper abdominal bowel gas pattern is unremarkable.  IMPRESSION: Negative chest x-ray.   Electronically Signed   By: Malachy Moan M.D.   On: 02/03/2015 03:20   Dg Knee Complete 4 Views Left  02/03/2015   CLINICAL DATA:  38 year old male with 1 week history of left knee pain and swelling  EXAM: LEFT KNEE - COMPLETE 4+ VIEW  COMPARISON:  Prior radiographs of the right knee 08/17/2012  FINDINGS: There is no evidence of fracture, dislocation, or joint effusion. There is no  evidence of arthropathy or other focal bone abnormality. Soft tissues are unremarkable.  IMPRESSION: Negative.   Electronically Signed   By: Malachy MoanHeath  McCullough M.D.   On: 02/03/2015 03:21     EKG Interpretation None      MDM   Final diagnoses:  Knee pain, acute, left  Cough  Pain, dental    39 y.o. male without pertinent PMH presents with numerous complaints as above.  Benign exam with exception of poor dentition and knee tenderness, no signs of ligamentous instability.  Dental block performed as above.  Wu unremarkable.  DC home in stable condition to fu with dentistry and PCP.  I have reviewed all laboratory and imaging studies if ordered as above  1. Knee pain, acute, left   2. Knee pain   3. Cough   4. Pain, dental         Mirian MoMatthew Gentry, MD 02/03/15  [phone]

## 2015-02-03 NOTE — ED Notes (Signed)
[facility] Ex[facility]:  Expected time:  Means of arrival:  Comments: Rm 2

## 2015-05-21 ENCOUNTER — Emergency Department (HOSPITAL_BASED_OUTPATIENT_CLINIC_OR_DEPARTMENT_OTHER)
Admission: EM | Admit: 2015-05-21 | Discharge: 2015-05-21 | Disposition: A | Discharge status: Home / Self Care | Payer: Self-pay | Attending: Emergency Medicine | Admitting: Emergency Medicine

## 2015-05-21 ENCOUNTER — Encounter (HOSPITAL_BASED_OUTPATIENT_CLINIC_OR_DEPARTMENT_OTHER): Payer: Self-pay | Admitting: Emergency Medicine

## 2015-05-21 DIAGNOSIS — M5441 Lumbago with sciatica, right side: Secondary | ICD-10-CM | POA: Insufficient documentation

## 2015-05-21 DIAGNOSIS — Z720 Tobacco use: Secondary | ICD-10-CM | POA: Insufficient documentation

## 2015-05-21 DIAGNOSIS — Z79899 Other long term (current) drug therapy: Secondary | ICD-10-CM | POA: Insufficient documentation

## 2015-05-21 DIAGNOSIS — Z87442 Personal history of urinary calculi: Secondary | ICD-10-CM | POA: Insufficient documentation

## 2015-05-21 DIAGNOSIS — Z87448 Personal history of other diseases of urinary system: Secondary | ICD-10-CM | POA: Insufficient documentation

## 2015-05-21 HISTORY — DX: Dorsalgia, unspecified: M54.9

## 2015-05-21 HISTORY — DX: Other psychoactive substance abuse, uncomplicated: F19.10

## 2015-05-21 MED ORDER — DEXAMETHASONE SODIUM PHOSPHATE 10 MG/ML IJ SOLN
10.0000 mg | Freq: Once | INTRAMUSCULAR | Status: AC
  Administered 2015-05-21: 10 mg via INTRAMUSCULAR
  Filled 2015-05-21: qty 1

## 2015-05-21 MED ORDER — DIAZEPAM 5 MG PO TABS: 2.5000 mg | ORAL_TABLET | Freq: Four times a day (QID) | ORAL | Status: DC | PRN

## 2015-05-21 MED ORDER — NAPROXEN 500 MG PO TABS: 500.0000 mg | ORAL_TABLET | Freq: Two times a day (BID) | ORAL | Status: DC

## 2015-05-21 MED ORDER — HYDROMORPHONE HCL 1 MG/ML IJ SOLN
1.0000 mg | Freq: Once | INTRAMUSCULAR | Status: AC
  Administered 2015-05-21: 1 mg via INTRAMUSCULAR
  Filled 2015-05-21: qty 1

## 2015-05-21 NOTE — ED Provider Notes (Signed)
CSN: [id number]     Arrival date & time 05/21/15  1635 History   First MD Initiated Contact with Patient 05/21/15 1828     Chief Complaint  Patient presents with  . Back Pain     (Consider location/radiation/quality/duration/timing/severity/associated sxs/prior Treatment) HPI     Jack Coleman is a 39 y.o. male who complains of low back pain for 3 week(s), positional with bending or lifting, with radiation down the R leg. Precipitating factors:long work hours. Prior history of back problems: recurrent self limited episodes of low back pain in the past. There is no numbness in the legs. Denies weakness, loss of bowel/bladder function or saddle anesthesia. Denies neck stiffness, headache, rash.  Denies fever or recent procedures to back. 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 He no longer uses narcotics for the past year. He denies urinary sxs.    Past Medical History  Diagnosis Date  . Renal disorder   . Kidney stones   . Back pain   . Drug abuse    Past Surgical History  Procedure Laterality Date  . Wrist surgery     No family history on file. Social History  Substance Use Topics  . Smoking status: Current Every Day Smoker -- 1.00 packs/day    Types: Cigarettes  . Smokeless tobacco: Never Used  . Alcohol Use: No    Review of Systems   Ten systems reviewed and are negative for acute change, except as noted in the HPI.   Allergies  Darvocet; Morphine and related; Ibuprofen; and Ultram  Home Medications   Prior to Admission medications   Medication Sig Start Date End Date Taking? Authorizing Provider  diazepam (VALIUM) 5 MG tablet Take 1 tablet (5 mg total) by mouth 2 (two) times daily. 11/26/14  Yes Junius Finner, PA-C  dicyclomine (BENTYL) 20 MG tablet Take 1 tablet (20 mg total) by mouth 2 (two) times daily. 12/27/14  Yes Jennifer Piepenbrink, PA-C   HYDROcodone-acetaminophen (NORCO/VICODIN) 5-325 MG per tablet Take 1 tablet by mouth every 4 (four) hours as needed. 01/26/15  Yes Garlon Hatchet, PA-C  ondansetron (ZOFRAN ODT) 4 MG disintegrating tablet Take 1 tablet (4 mg total) by mouth every 8 (eight) hours as needed for nausea. 12/27/14  Yes Jennifer Piepenbrink, PA-C  ondansetron (ZOFRAN) 4 MG tablet Take 1 tablet (4 mg total) by mouth every 6 (six) hours. 11/26/14  Yes Junius Finner, PA-C  penicillin v potassium (VEETID) 500 MG tablet Take 1 tablet (500 mg total) by mouth 4 (four) times daily. 01/26/15  Yes Garlon Hatchet, PA-C  polyethylene glycol powder (GLYCOLAX/MIRALAX) powder Take 17 g by mouth 2 (two) times daily. Until daily soft stools  OTC 12/27/14  Yes Jennifer Piepenbrink, PA-C  tamsulosin (FLOMAX) 0.4 MG CAPS Take 1 capsule (0.4 mg total) by mouth daily. 03/28/13  Yes Teressa Lower, NP  acetaminophen (TYLENOL) 500 MG tablet Take 1,000 mg by mouth every 6 (six) hours as needed for moderate pain. Patient used this medication for pain.    Historical Provider, MD  dextromethorphan-guaiFENesin (MUCINEX DM) 30-600 MG per 12 hr tablet Take 1 tablet by mouth 2 (two) times daily. Be sure to take with 1 large glass of water Patient not taking: Reported on 02/03/2015 11/26/14   Junius Finner, PA-C  ibuprofen (ADVIL,MOTRIN) 600 MG tablet Take 1 tablet (600 mg total) by mouth every 6 (six) hours as needed. Patient taking differently:  Take 600 mg by mouth every 6 (six) hours as needed for moderate pain.  11/26/14   Junius Finner, PA-C   BP 123/79 mmHg  Pulse 94  Temp(Src) 98.7 F (37.1 C) (Oral)  Resp 18  Ht 6\' 1"  (1.854 m)  Wt 229 lb (103.874 kg)  BMI 30.22 kg/m2  SpO2 98% Physical Exam  Constitutional: He appears well-developed and well-nourished. No distress.  HENT:  Head: Normocephalic and atraumatic.  Eyes: Conjunctivae are normal. No scleral icterus.  Neck: Normal range of motion. Neck supple.  Cardiovascular: Normal rate,  regular rhythm and normal heart sounds.   Pulmonary/Chest: Effort normal and breath sounds normal. No respiratory distress.  Abdominal: Soft. There is no tenderness.  Musculoskeletal: He exhibits no edema.   Lumbosacral spine area reveals no midline tenderness or mass.  Painful and reduced LS ROM noted. Straight leg raise is positive .  DTR's, motor strength and sensation normal, including heel and toe gait.  Peripheral pulses are palpable.  Neurological: He is alert.  Skin: Skin is warm and dry. He is not diaphoretic.  Psychiatric: His behavior is normal.  Nursing note and vitals reviewed.   ED Course  Procedures (including critical care time) Labs Review Labs Reviewed - No data to display  Imaging Review No results found. Lucila Maine, personally reviewed and evaluated these images and lab results as part of my medical decision-making.   EKG Interpretation None      MDM   Final diagnoses:  Right-sided low back pain with right-sided sciatica    Patient with back pain.  No neurological deficits and normal neuro exam.  Patient can walk but states is painful.  No loss of bowel or bladder control.  No concern for cauda equina.  No fever, night sweats, weight loss, h/o cancer, IVDU.  RICE protocol and pain medicine indicated and discussed with patient.      Arthor Captain, PA-C 05/21/15 1910  Arby Barrette, MD 05/23/15 [phone]

## 2015-05-21 NOTE — ED Notes (Signed)
Back spasms  Rt leg pain

## 2015-05-21 NOTE — Discharge Instructions (Signed)
SEEK IMMEDIATE MEDICAL ATTENTION IF: °New numbness, tingling, weakness, or problem with the use of your arms or legs.  °Severe back pain not relieved with medications.  °Change in bowel or bladder control.  °Increasing pain in any areas of the body (such as chest or abdominal pain).  °Shortness of breath, dizziness or fainting.  °Nausea (feeling sick to your stomach), vomiting, fever, or sweats. ° ° ° °Chronic Back Pain ° When back pain lasts longer than 3 months, it is called chronic back pain. People with chronic back pain often go through certain periods that are more intense (flare-ups).  °CAUSES °Chronic back pain can be caused by wear and tear (degeneration) on different structures in your back. These structures include: °· The bones of your spine (vertebrae) and the joints surrounding your spinal cord and nerve roots (facets). °· The strong, fibrous tissues that connect your vertebrae (ligaments). °Degeneration of these structures may result in pressure on your nerves. This can lead to constant pain. °HOME CARE INSTRUCTIONS °· Avoid bending, heavy lifting, prolonged sitting, and activities which make the problem worse. °· Take brief periods of rest throughout the day to reduce your pain. Lying down or standing usually is better than sitting while you are resting. °· Take over-the-counter or prescription medicines only as directed by your caregiver. °SEEK IMMEDIATE MEDICAL CARE IF:  °· You have weakness or numbness in one of your legs or feet. °· You have trouble controlling your bladder or bowels. °· You have nausea, vomiting, abdominal pain, shortness of breath, or fainting. °Document Released: 11/03/2004 Document Revised: 12/19/2011 Document Reviewed: 09/10/2011 °ExitCare® Patient Information ©2015 [employer]. This information is not intended to replace advice given to you by your health care provider. Make sure you discuss any questions you have with your health care provider. ° °Sciatica °Sciatica is  pain, weakness, numbness, or tingling along the path of the sciatic nerve. The nerve starts in the lower back and runs down the back of each leg. The nerve controls the muscles in the lower leg and in the back of the knee, while also providing sensation to the back of the thigh, lower leg, and the sole of your foot. Sciatica is a symptom of another medical condition. For instance, nerve damage or certain conditions, such as a herniated disk or bone spur on the spine, pinch or put pressure on the sciatic nerve. This causes the pain, weakness, or other sensations normally associated with sciatica. Generally, sciatica only affects one side of the body. °CAUSES  °· Herniated or slipped disc. °· Degenerative disk disease. °· A pain disorder involving the narrow muscle in the buttocks (piriformis syndrome). °· Pelvic injury or fracture. °· Pregnancy. °· Tumor (rare). °SYMPTOMS  °Symptoms can vary from mild to very severe. The symptoms usually travel from the low back to the buttocks and down the back of the leg. Symptoms can include: °· Mild tingling or dull aches in the lower back, leg, or hip. °· Numbness in the back of the calf or sole of the foot. °· Burning sensations in the lower back, leg, or hip. °· Sharp pains in the lower back, leg, or hip. °· Leg weakness. °· Severe back pain inhibiting movement. °These symptoms may get worse with coughing, sneezing, laughing, or prolonged sitting or standing. Also, being overweight may worsen symptoms. °DIAGNOSIS  °Your caregiver will perform a physical exam to look for common symptoms of sciatica. He or she may ask you to do certain movements or activities that would trigger   sciatic nerve pain. Other tests may be performed to find the cause of the sciatica. These may include: °· Blood tests. °· X-rays. °· Imaging tests, such as an MRI or CT scan. °TREATMENT  °Treatment is directed at the cause of the sciatic pain. Sometimes, treatment is not necessary and the pain and  discomfort goes away on its own. If treatment is needed, your caregiver may suggest: °· Over-the-counter medicines to relieve pain. °· Prescription medicines, such as anti-inflammatory medicine, muscle relaxants, or narcotics. °· Applying heat or ice to the painful area. °· Steroid injections to lessen pain, irritation, and inflammation around the nerve. °· Reducing activity during periods of pain. °· Exercising and stretching to strengthen your abdomen and improve flexibility of your spine. Your caregiver may suggest losing weight if the extra weight makes the back pain worse. °· Physical therapy. °· Surgery to eliminate what is pressing or pinching the nerve, such as a bone spur or part of a herniated disk. °HOME CARE INSTRUCTIONS  °· Only take over-the-counter or prescription medicines for pain or discomfort as directed by your caregiver. °· Apply ice to the affected area for 20 minutes, 3-4 times a day for the first 48-72 hours. Then try heat in the same way. °· Exercise, stretch, or perform your usual activities if these do not aggravate your pain. °· Attend physical therapy sessions as directed by your caregiver. °· Keep all follow-up appointments as directed by your caregiver. °· Do not wear high heels or shoes that do not provide proper support. °· Check your mattress to see if it is too soft. A firm mattress may lessen your pain and discomfort. °SEEK IMMEDIATE MEDICAL CARE IF:  °· You lose control of your bowel or bladder (incontinence). °· You have increasing weakness in the lower back, pelvis, buttocks, or legs. °· You have redness or swelling of your back. °· You have a burning sensation when you urinate. °· You have pain that gets worse when you lie down or awakens you at night. °· Your pain is worse than you have experienced in the past. °· Your pain is lasting longer than 4 weeks. °· You are suddenly losing weight without reason. °MAKE SURE YOU: °· Understand these instructions. °· Will watch your  condition. °· Will get help right away if you are not doing well or get worse. °Document Released: 09/20/2001 Document Revised: 03/27/2012 Document Reviewed: 02/05/2012 °ExitCare® Patient Information ©2015 [employer]. This information is not intended to replace advice given to you by your health care provider. Make sure you discuss any questions you have with your health care provider. ° °

## 2015-06-29 IMAGING — CT CT RENAL STONE PROTOCOL
2 of 4 series · 16 of 46 positions shown, 18 images · non-contrast
Comparison: 03/28/2013

CLINICAL DATA: Left lower abdominal pain, kidney stones

EXAM:
CT ABDOMEN AND PELVIS WITHOUT CONTRAST
TECHNIQUE: Multidetector CT imaging of the abdomen and pelvis was performed
following the standard protocol without IV contrast.

[Series 2: renal stone < 200 lbs 5.0 b31f · axial · 0.89mm/px · z∈[-773,-268]mm · 13 of 111 slices shown, 15 images]
[im 5/111  soft-tissue]
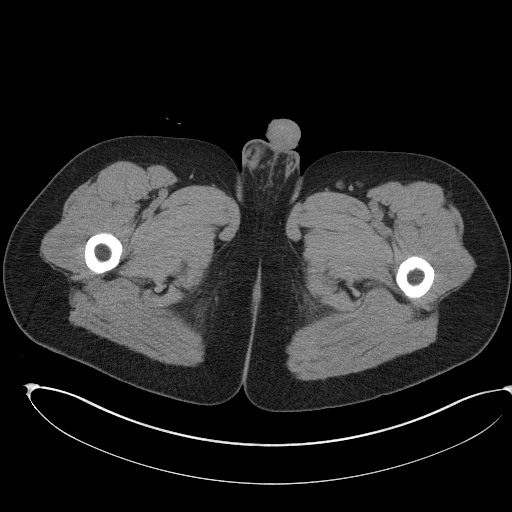
[im 5/111  bone]
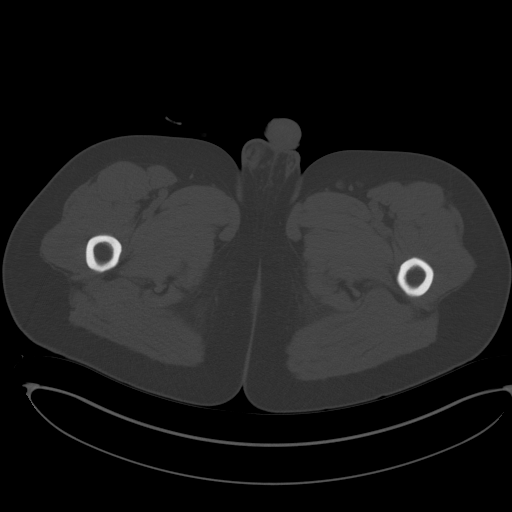
[im 14/111  soft-tissue]
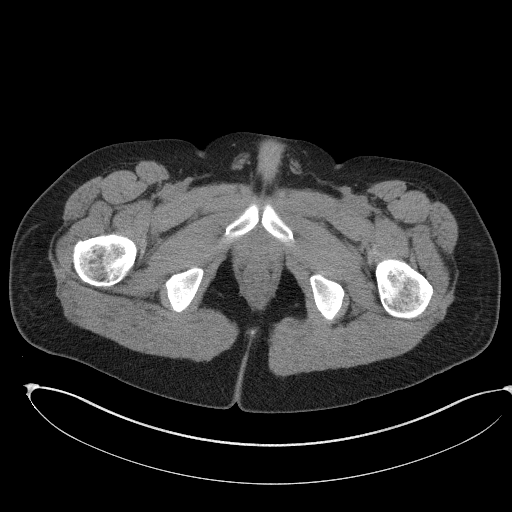
[im 23/111  soft-tissue]
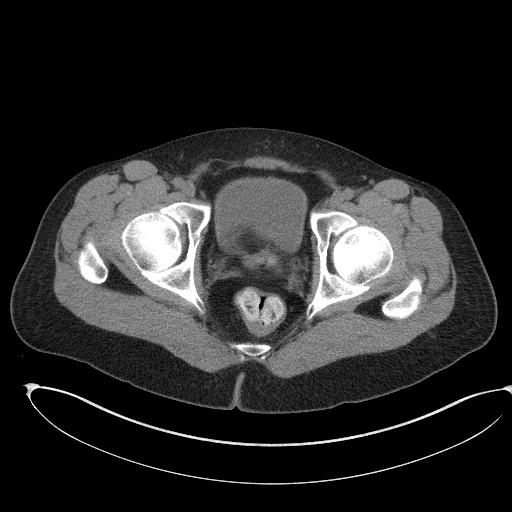
[im 31/111  soft-tissue]
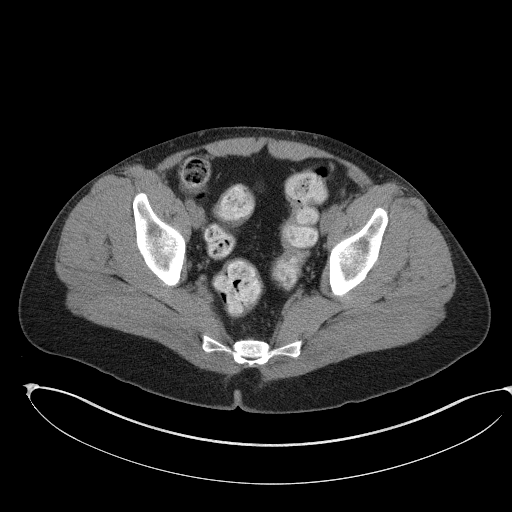
[im 40/111  soft-tissue]
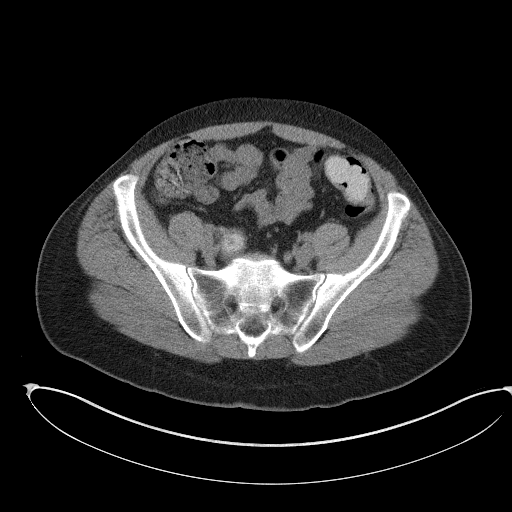
[im 49/111  soft-tissue]
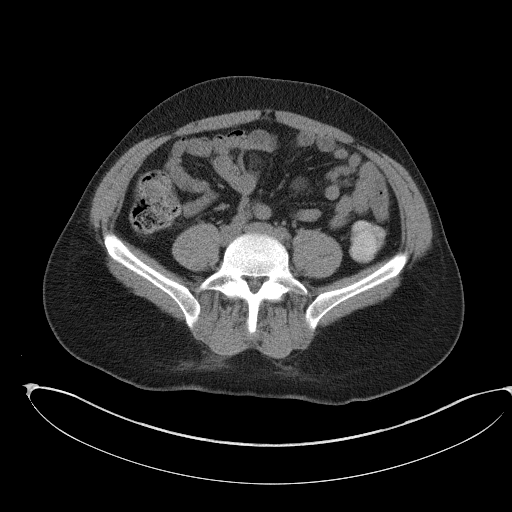
[im 58/111  soft-tissue]
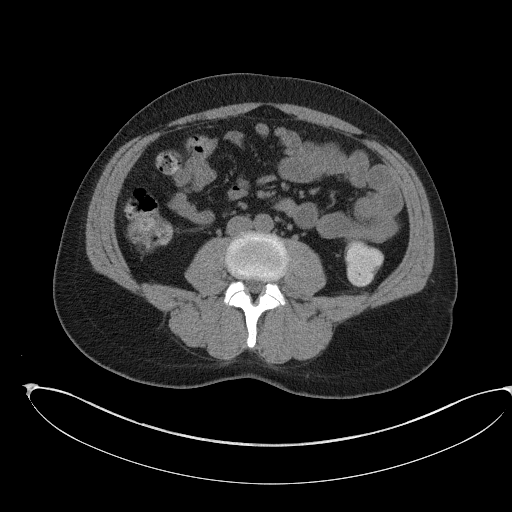
[im 62/111  soft-tissue]
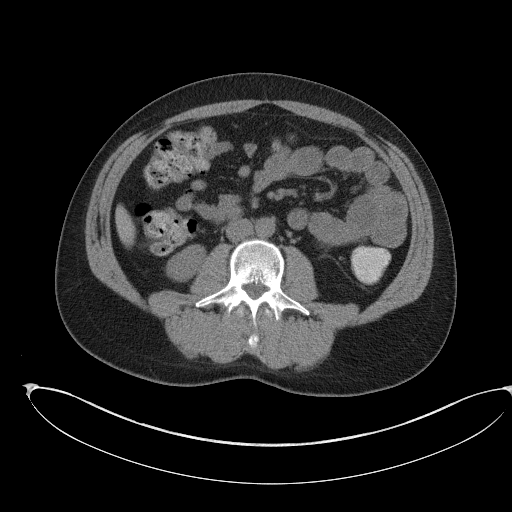
[im 71/111  soft-tissue]
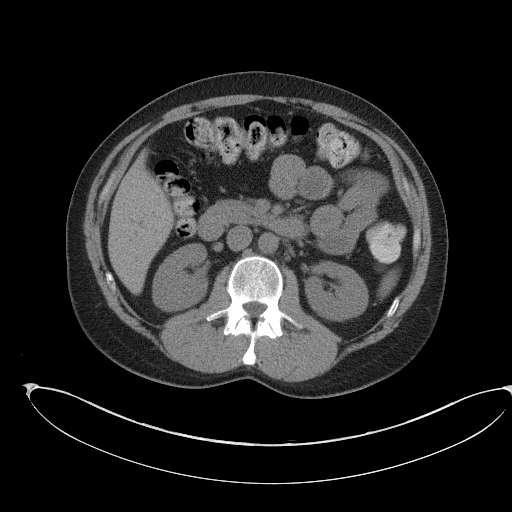
[im 71/111  bone]
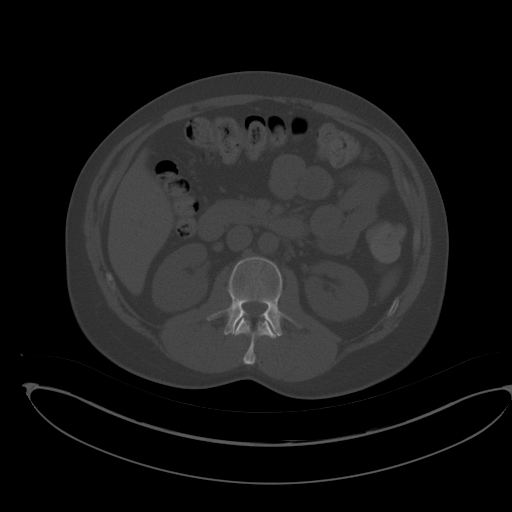
[im 80/111  soft-tissue]
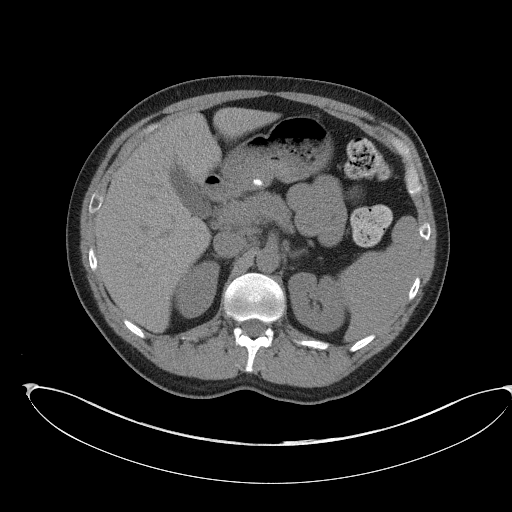
[im 89/111  soft-tissue]
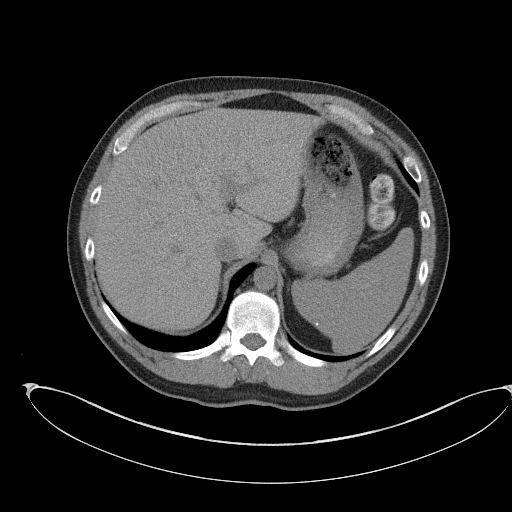
[im 97/111  soft-tissue]
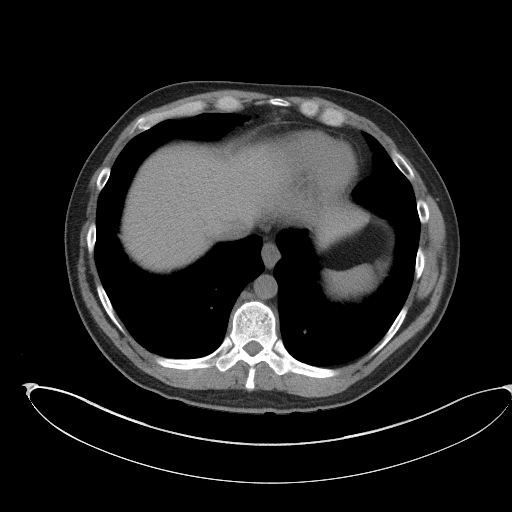
[im 106/111  soft-tissue]
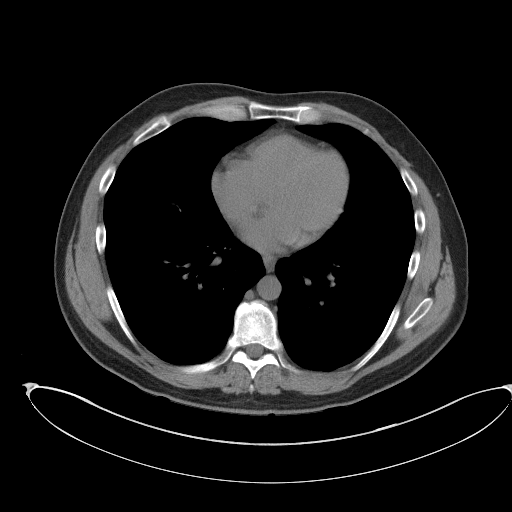

[Series 5: renal stone 3.0 coronal · coronal · 0.86mm/px · 3 of 94 slices shown]
[im 32/94  soft-tissue]
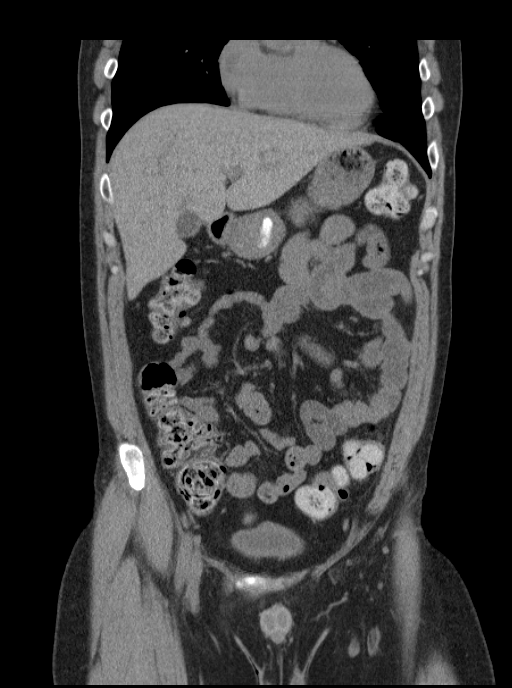
[im 42/94  soft-tissue]
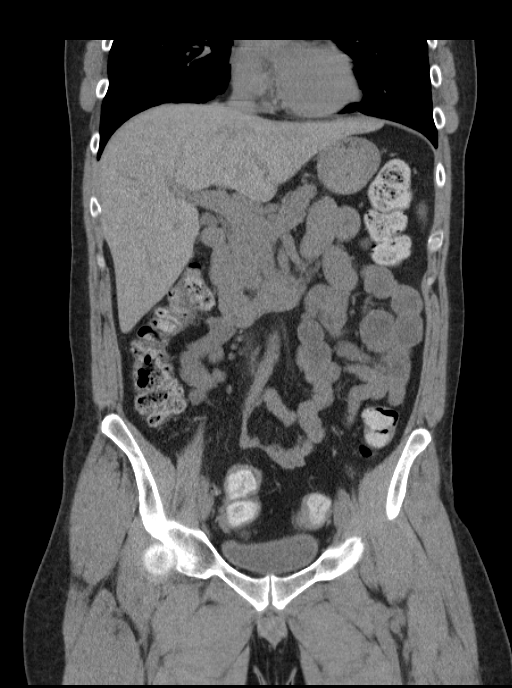
[im 52/94  soft-tissue]
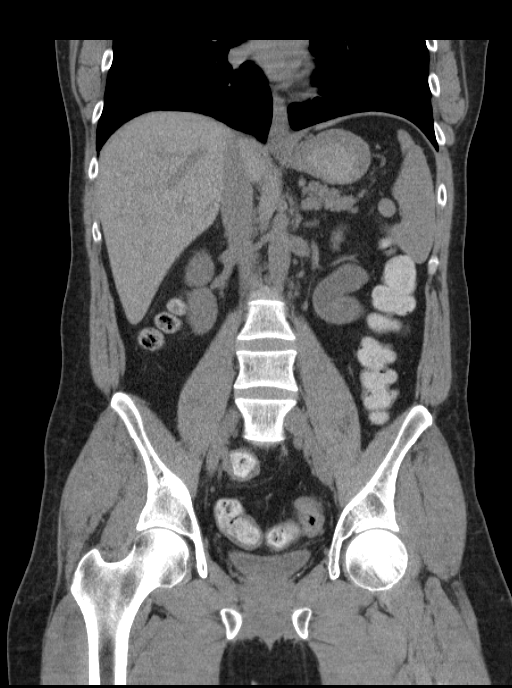

[16 of 46 positions shown; findings below may reference images not displayed]

FINDINGS: Lower chest:  Mild atelectasis at the lung bases.

Hepatobiliary: Unenhanced liver is unremarkable.

Gallbladder is unremarkable. No intrahepatic or extrahepatic ductal
dilatation.

Pancreas: Within normal limits.

Spleen: At the upper limits of normal for size.

Adrenals/Urinary Tract: Adrenal glands are unremarkable.

Kidneys are within normal limits.

No renal, ureteral, or bladder calculi.

Bladder is within normal limits.

Stomach/Bowel: Stomach is within normal limits.

No evidence of bowel obstruction.

Normal appendix.

Moderate colonic stool burden, suggesting constipation.

Vascular/Lymphatic: No evidence of abdominal aortic aneurysm.

No suspicious abdominopelvic lymphadenopathy.

Reproductive: Prostate is unremarkable.

Other: No abdominopelvic ascites.

Small fat containing right inguinal hernia.

Left pelvic calcifications (series 2/images 88 and 93) are distinct
from the left ureter and reflect pelvic phleboliths.

Musculoskeletal: Visualized osseous structures are within normal
limits.
IMPRESSION: No renal, ureteral, or bladder calculi.  No hydronephrosis.

No evidence of bowel obstruction.  Normal appendix.

Moderate colonic stool burden, suggesting constipation.

## 2015-08-06 IMAGING — CR DG KNEE COMPLETE 4+V*L*
4 series · 4 of 4 positions shown · non-contrast
Comparison: Prior radiographs of the right knee 08/17/2012

CLINICAL DATA: 38-year-old male with 1 week history of left knee
pain and swelling

EXAM:
LEFT KNEE - COMPLETE 4+ VIEW

[w knee ap left (1 of 2)]
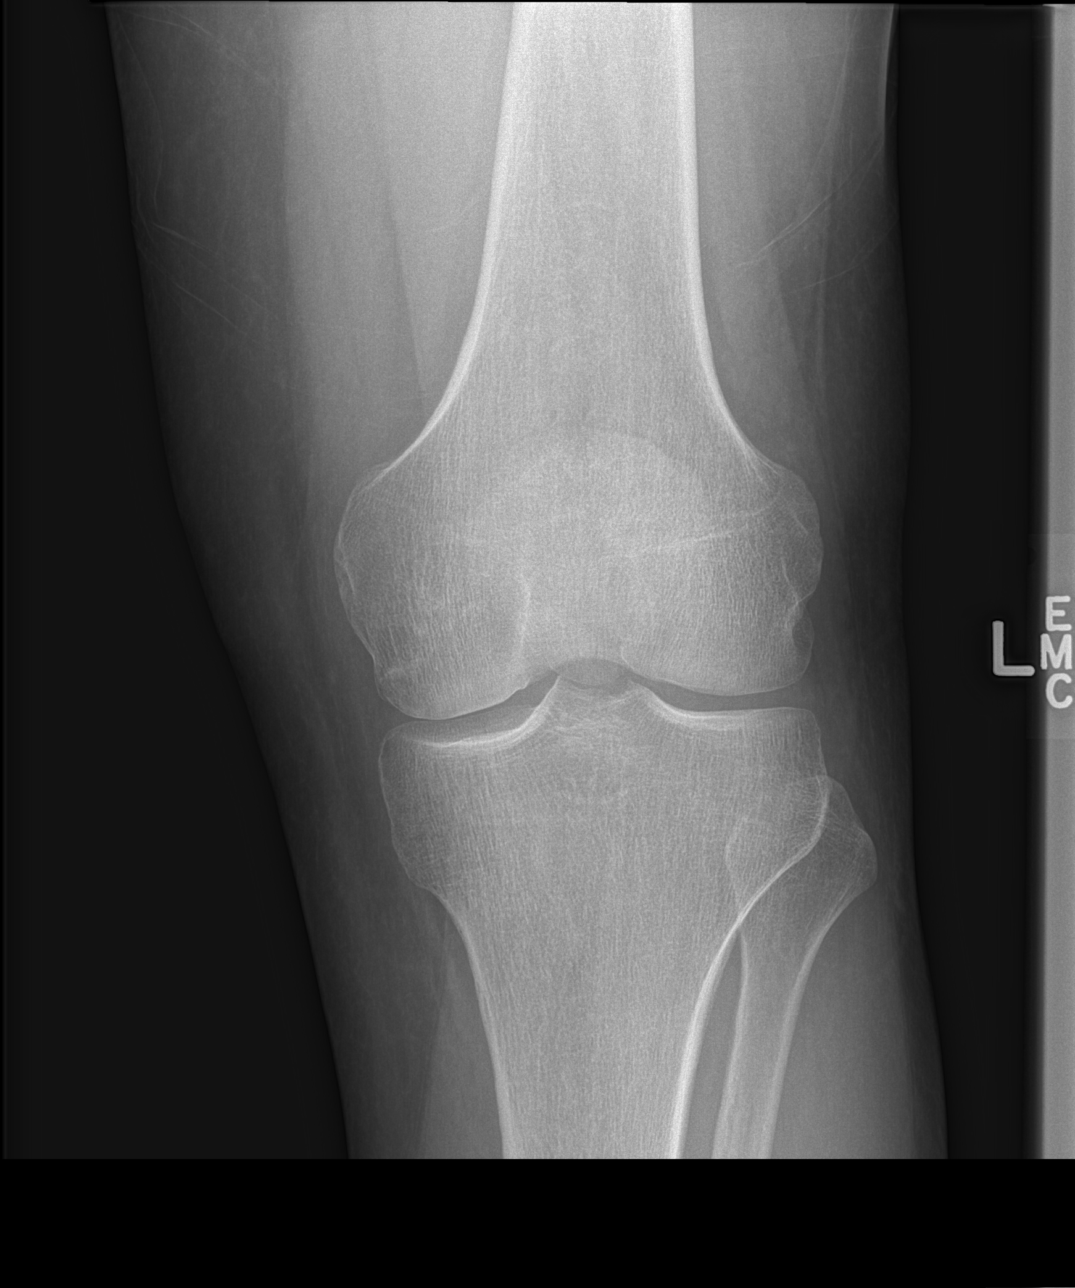

[w knee ap left (2 of 2)]
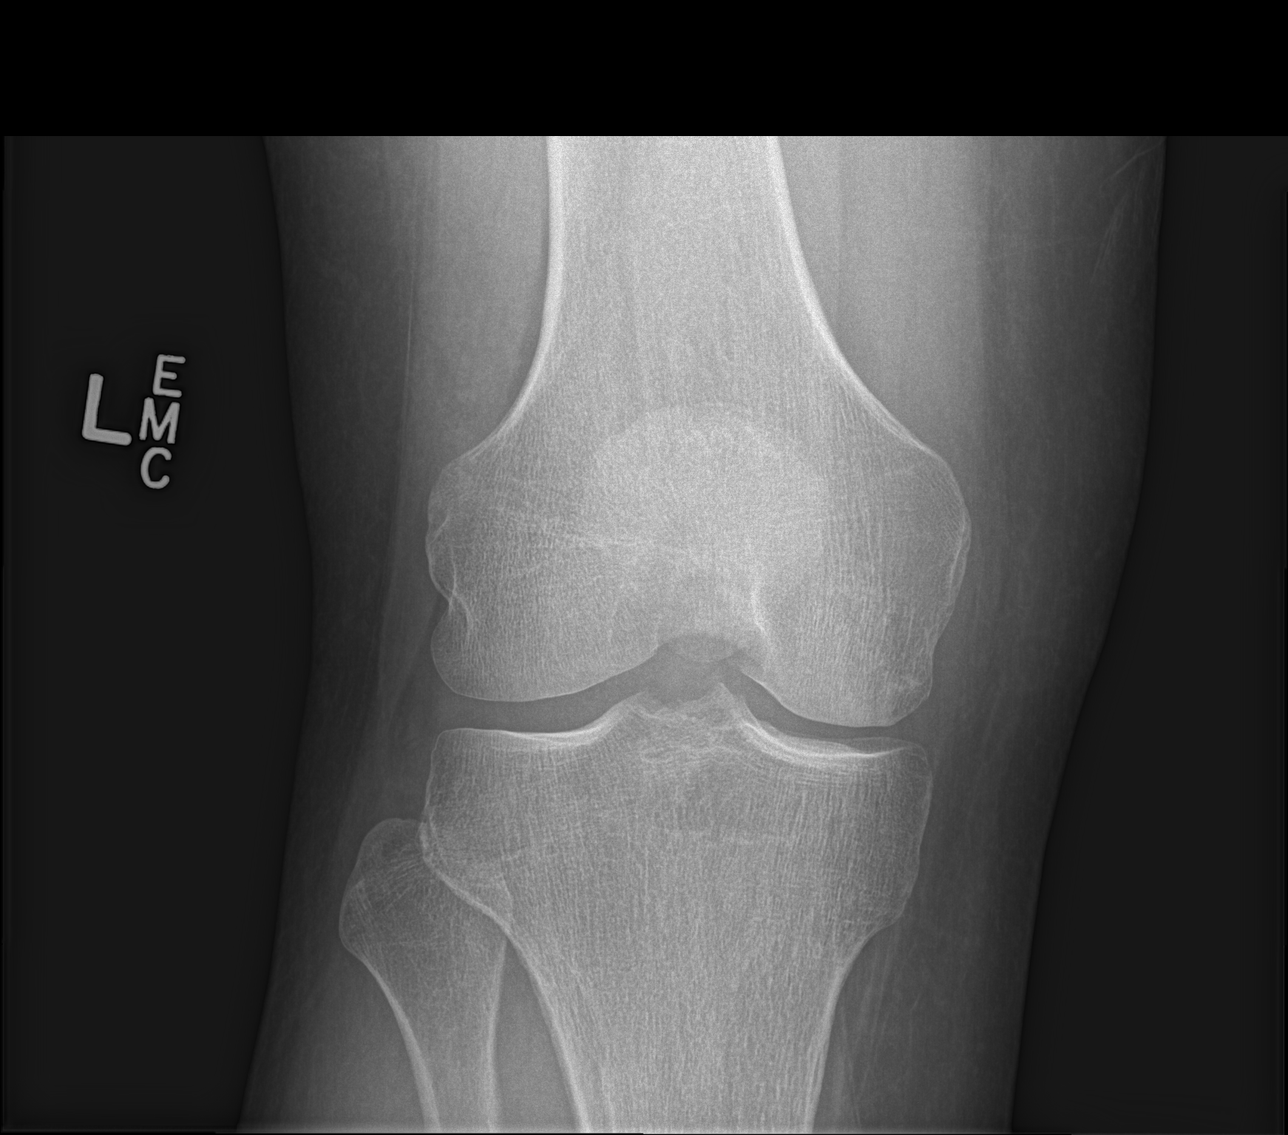

[t knee lat left]
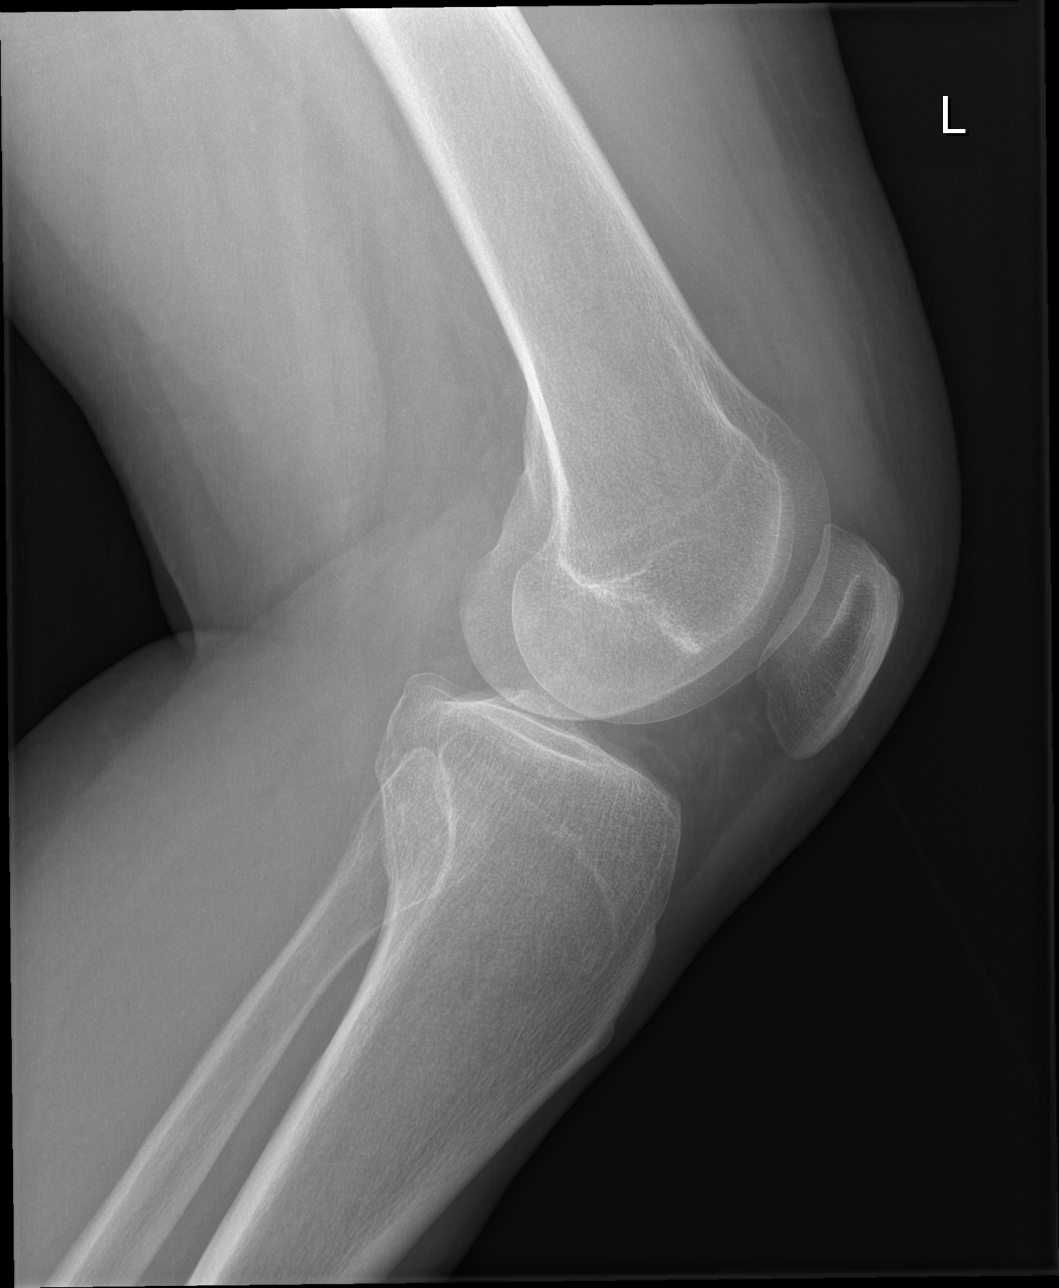

[x knee ap left]
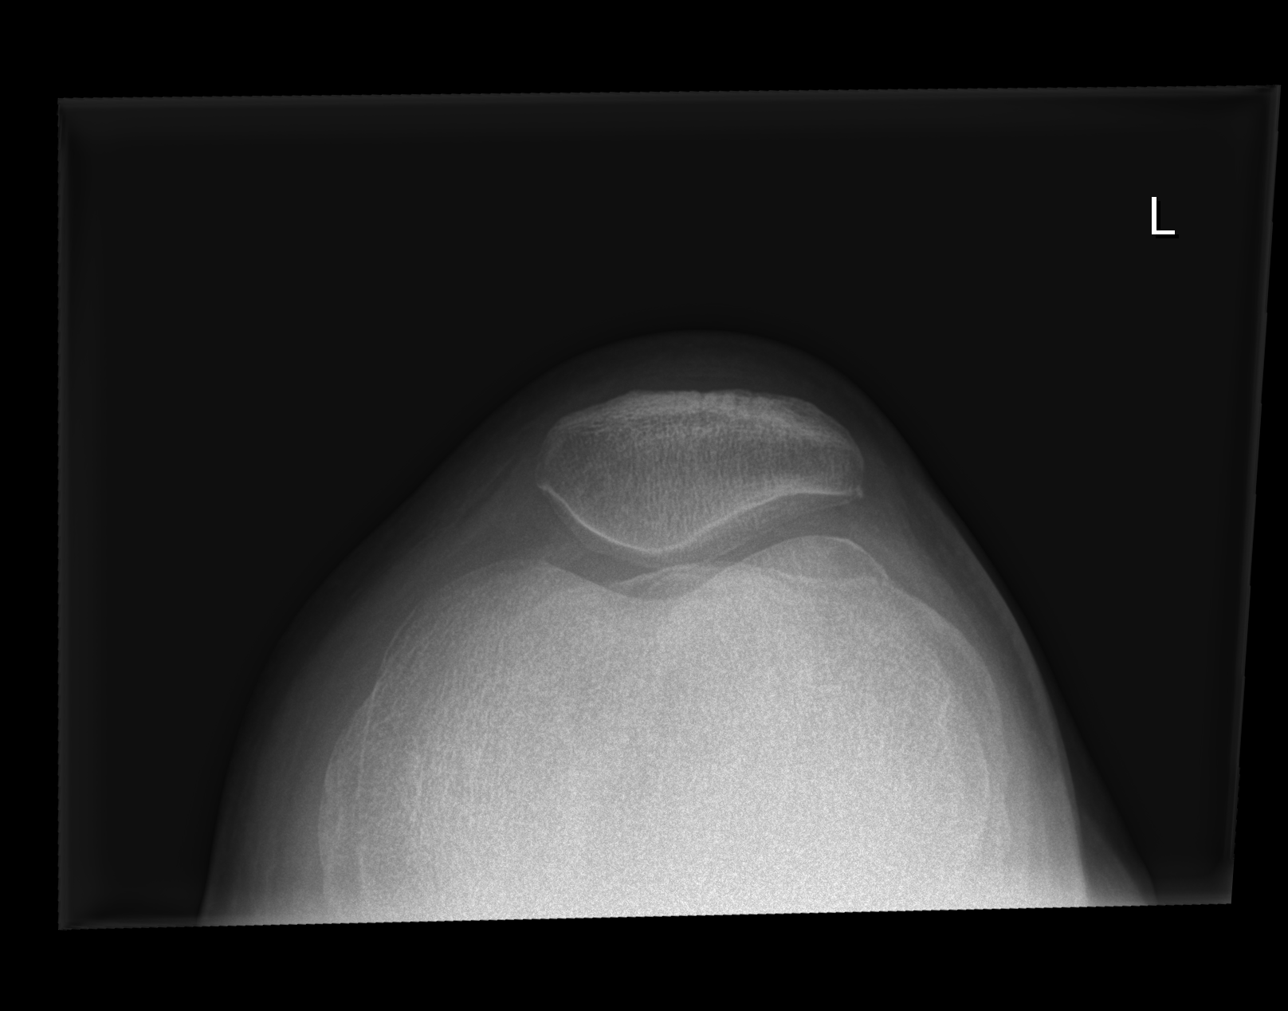

[4 of 4 positions shown; findings below may reference images not displayed]

FINDINGS: There is no evidence of fracture, dislocation, or joint effusion.
There is no evidence of arthropathy or other focal bone abnormality.
Soft tissues are unremarkable.
IMPRESSION: Negative.

## 2015-11-02 ENCOUNTER — Emergency Department (HOSPITAL_BASED_OUTPATIENT_CLINIC_OR_DEPARTMENT_OTHER)
Admission: EM | Admit: 2015-11-02 | Discharge: 2015-11-02 | Disposition: A | Discharge status: Home / Self Care | Payer: Self-pay | Attending: Emergency Medicine | Admitting: Emergency Medicine

## 2015-11-02 ENCOUNTER — Encounter (HOSPITAL_BASED_OUTPATIENT_CLINIC_OR_DEPARTMENT_OTHER): Payer: Self-pay

## 2015-11-02 DIAGNOSIS — Z87442 Personal history of urinary calculi: Secondary | ICD-10-CM | POA: Insufficient documentation

## 2015-11-02 DIAGNOSIS — Z87448 Personal history of other diseases of urinary system: Secondary | ICD-10-CM | POA: Insufficient documentation

## 2015-11-02 DIAGNOSIS — F17210 Nicotine dependence, cigarettes, uncomplicated: Secondary | ICD-10-CM | POA: Insufficient documentation

## 2015-11-02 DIAGNOSIS — K047 Periapical abscess without sinus: Secondary | ICD-10-CM | POA: Insufficient documentation

## 2015-11-02 MED ORDER — OXYCODONE-ACETAMINOPHEN 5-325 MG PO TABS: 1.0000 | ORAL_TABLET | ORAL | Status: DC | PRN

## 2015-11-02 MED ORDER — AMOXICILLIN 500 MG PO CAPS: 500.0000 mg | ORAL_CAPSULE | Freq: Three times a day (TID) | ORAL | Status: DC

## 2015-11-02 MED FILL — AMOXICILLIN 500 MG CAPSULE: 500 | 10 days supply | Qty: 30 | Fill #0

## 2015-11-02 MED FILL — OXYCODONE/APAP 5-325: 5-325 | 2 days supply | Qty: 10 | Fill #0

## 2015-11-02 NOTE — ED Notes (Signed)
Right lower tooth ache x 3 days-NAD

## 2015-11-02 NOTE — Discharge Instructions (Signed)
Dental Abscess °A dental abscess is a collection of pus in or around a tooth. °CAUSES °This condition is caused by a bacterial infection around the root of the tooth that involves the inner part of the tooth (pulp). It may result from: °· Severe tooth decay. °· Trauma to the tooth that allows bacteria to enter into the pulp, such as a broken or chipped tooth. °· Severe gum disease around a tooth. °SYMPTOMS °Symptoms of this condition include: °· Severe pain in and around the infected tooth. °· Swelling and redness around the infected tooth, in the mouth, or in the face. °· Tenderness. °· Pus drainage. °· Bad breath. °· Bitter taste in the mouth. °· Difficulty swallowing. °· Difficulty opening the mouth. °· Nausea. °· Vomiting. °· Chills. °· Swollen neck glands. °· Fever. °DIAGNOSIS °This condition is diagnosed with examination of the infected tooth. During the exam, your dentist may tap on the infected tooth. Your dentist will also ask about your medical and dental history and may order X-rays. °TREATMENT °This condition is treated by eliminating the infection. This may be done with: °· Antibiotic medicine. °· A root canal. This may be performed to save the tooth. °· Pulling (extracting) the tooth. This may also involve draining the abscess. This is done if the tooth cannot be saved. °HOME CARE INSTRUCTIONS °· Take medicines only as directed by your dentist. °· If you were prescribed antibiotic medicine, finish all of it even if you start to feel better. °· Rinse your mouth (gargle) often with salt water to relieve pain or swelling. °· Do not drive or operate heavy machinery while taking pain medicine. °· Do not apply heat to the outside of your mouth. °· Keep all follow-up visits as directed by your dentist. This is important. °SEEK MEDICAL CARE IF: °· Your pain is worse and is not helped by medicine. °SEEK IMMEDIATE MEDICAL CARE IF: °· You have a fever or chills. °· Your symptoms suddenly get worse. °· You have a  very bad headache. °· You have problems breathing or swallowing. °· You have trouble opening your mouth. °· You have swelling in your neck or around your eye. °  °This information is not intended to replace advice given to you by your health care provider. Make sure you discuss any questions you have with your health care provider. °  °Document Released: 09/26/2005 Document Revised: 02/10/2015 Document Reviewed: 09/23/2014 °Elsevier Interactive Patient Education ©2016 Elsevier Inc. ° °[employer]  °[employer]  °[facility]  °[address]  °[place], [place] [address]  °Phone [phone]  °[facility] [employer] [facility] [place], [place], exemplifies the Dental School’s vision to improve the health and quality of life of all [place]ians by creating leaders with a passion to care for the underserved and by leading the nation in community-based, service learning oral health education. °We are committed to offering comprehensive general dental services for adults, children and special needs patients in a safe, caring and professional setting. ° °Appointments: Our clinic is open Monday through Friday 8:00 a.m. until 5:00 p.m. The amount of time scheduled for an appointment depends on the patient’s specific needs. We ask that you keep your appointed time for care or provide 24-hour notice of all appointment changes. Parents or legal guardians must accompany minor children. ° °Payment for Services: Medicaid and other insurance plans are welcome. Payment for services is due when services are   rendered and may be made by cash or credit card. If you have dental insurance, we will assist you with your claim submission.  ° °Emergencies: Emergency services will be provided Monday through Friday on a walk-in basis. Please arrive early for emergency services. After hours emergency services  will be provided for patients of record as required. ° °Services:  °Comprehensive General Dentistry  °Children’s Dentistry  °Oral Surgery - Extractions  °Root Canals  °Sealants and Tooth Colored Fillings  °Crowns and Bridges  °Dentures and Partial Dentures  °Implant Services  °Periodontal Services and Cleanings  °Cosmetic Tooth Whitening  °Digital Radiography  °3-D/[facility] ° ° °

## 2015-11-02 NOTE — ED Provider Notes (Signed)
CSN: [id number]     Arrival date & time 11/02/15  1644 History   First MD Initiated Contact with Patient 11/02/15 1704     Chief Complaint  Patient presents with  . Dental Pain     (Consider location/radiation/quality/duration/timing/severity/associated sxs/prior Treatment) HPI  This is a 40 year old male who presents emergency Department with chief complaint of right lower dental pain. He has a history of poor dentition and smokes daily. Patient states that he broke off his tooth a few years ago and has had pain on and off on the right lower second molar region. The patient is past the gumline. Patient noticed 2 days ago. Today he developed swelling and tenderness in the gumline. He notices a "ball that is very tender" on the gumline. He denies difficulty breathing or swallowing. He denies fevers or chills. Past Medical History  Diagnosis Date  . Renal disorder   . Kidney stones   . Back pain   . Drug abuse    Past Surgical History  Procedure Laterality Date  . Wrist surgery     No family history on file. Social History  Substance Use Topics  . Smoking status: Current Every Day Smoker -- 1.00 packs/day    Types: Cigarettes  . Smokeless tobacco: Never Used  . Alcohol Use: No    Review of Systems  Constitutional: Negative for fever and chills.  HENT: Positive for dental problem. Negative for trouble swallowing.   Respiratory: Negative for stridor.   Musculoskeletal: Negative for neck pain.      Allergies  Darvocet; Morphine and related; Ibuprofen; and Ultram  Home Medications   Prior to Admission medications   Medication Sig Start Date End Date Taking? Authorizing Provider  acetaminophen (TYLENOL) 500 MG tablet Take 1,000 mg by mouth every 6 (six) hours as needed for moderate pain. Patient used this medication for pain.    Historical Provider, MD   BP 145/93 mmHg  Pulse 86  Temp(Src) 98.5 F (36.9 C) (Oral)  Resp 16  Ht  (1.854 m)  Wt 108.863 kg  BMI  31.67 kg/m2  SpO2 100% Physical Exam  Constitutional: He appears well-developed and well-nourished. No distress.  Patient appears uncomfortable  HENT:  Head: Normocephalic and atraumatic.  Mouth/Throat:    Eyes: Conjunctivae are normal. No scleral icterus.  Neck: Normal range of motion. Neck supple.  Cardiovascular: Normal rate, regular rhythm and normal heart sounds.   Pulmonary/Chest: Effort normal and breath sounds normal. No respiratory distress.  Abdominal: Soft. There is no tenderness.  Musculoskeletal: He exhibits no edema.  Neurological: He is alert.  Skin: Skin is warm and dry. He is not diaphoretic.  Psychiatric: His behavior is normal.  Nursing note and vitals reviewed.   ED Course  Procedures (including critical care time) Labs Review Labs Reviewed - No data to display  Imaging Review No results found. I have personally reviewed and evaluated these images and lab results as part of my medical decision-making.   EKG Interpretation None      MDM   Final diagnoses:  None    Patient here with complaint of dental pain. He has an apparent developing dental abscess, which is not ready for drain. I reviewed the patient's on the [place] controlled substances database. He has a prescription for lorazepam but no medications for pain. Prescribed 10 Norco for dental infection and high dose amoxicillin for 10 days. Patient given referral to dentist and the [employer] [facility] which has an emergency clinic.  Patient appears safe for discharge at this time. No signs of Ludwig angina    Arthor Captain, PA-C 11/02/15 1730  Jerelyn Scott, MD 11/02/15 [phone]

## 2016-08-23 ENCOUNTER — Encounter (HOSPITAL_BASED_OUTPATIENT_CLINIC_OR_DEPARTMENT_OTHER): Payer: Self-pay | Admitting: *Deleted

## 2016-08-23 ENCOUNTER — Emergency Department (HOSPITAL_BASED_OUTPATIENT_CLINIC_OR_DEPARTMENT_OTHER)
Admission: EM | Admit: 2016-08-23 | Discharge: 2016-08-23 | Disposition: A | Discharge status: Home / Self Care | Payer: No Typology Code available for payment source | Attending: Emergency Medicine | Admitting: Emergency Medicine

## 2016-08-23 DIAGNOSIS — Y939 Activity, unspecified: Secondary | ICD-10-CM | POA: Diagnosis not present

## 2016-08-23 DIAGNOSIS — F17210 Nicotine dependence, cigarettes, uncomplicated: Secondary | ICD-10-CM | POA: Diagnosis not present

## 2016-08-23 DIAGNOSIS — M549 Dorsalgia, unspecified: Secondary | ICD-10-CM

## 2016-08-23 DIAGNOSIS — M546 Pain in thoracic spine: Secondary | ICD-10-CM | POA: Diagnosis not present

## 2016-08-23 DIAGNOSIS — M542 Cervicalgia: Secondary | ICD-10-CM | POA: Insufficient documentation

## 2016-08-23 DIAGNOSIS — Y92410 Unspecified street and highway as the place of occurrence of the external cause: Secondary | ICD-10-CM | POA: Diagnosis not present

## 2016-08-23 DIAGNOSIS — S299XXA Unspecified injury of thorax, initial encounter: Secondary | ICD-10-CM | POA: Diagnosis present

## 2016-08-23 DIAGNOSIS — Y999 Unspecified external cause status: Secondary | ICD-10-CM | POA: Diagnosis not present

## 2016-08-23 MED ORDER — DIAZEPAM 5 MG PO TABS: 5.0000 mg | ORAL_TABLET | Freq: Two times a day (BID) | ORAL | 0 refills | Status: AC
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
## 2016-08-23 NOTE — Discharge Instructions (Signed)
Take the prescribed medication as directed. Follow-up with your chiropractor tomorrow as scheduled. Return to the ED for new or worsening symptoms.

## 2017-06-05 ENCOUNTER — Emergency Department (HOSPITAL_BASED_OUTPATIENT_CLINIC_OR_DEPARTMENT_OTHER)
Admission: EM | Admit: 2017-06-05 | Discharge: 2017-06-05 | Disposition: A | Discharge status: Home / Self Care | Payer: Self-pay | Attending: Emergency Medicine | Admitting: Emergency Medicine

## 2017-06-05 ENCOUNTER — Encounter (HOSPITAL_BASED_OUTPATIENT_CLINIC_OR_DEPARTMENT_OTHER): Payer: Self-pay | Admitting: *Deleted

## 2017-06-05 DIAGNOSIS — K047 Periapical abscess without sinus: Secondary | ICD-10-CM | POA: Insufficient documentation

## 2017-06-05 DIAGNOSIS — F17210 Nicotine dependence, cigarettes, uncomplicated: Secondary | ICD-10-CM | POA: Insufficient documentation

## 2017-06-05 DIAGNOSIS — Z79899 Other long term (current) drug therapy: Secondary | ICD-10-CM | POA: Insufficient documentation

## 2017-06-05 MED ORDER — CLINDAMYCIN HCL 150 MG PO CAPS: 450.0000 mg | ORAL_CAPSULE | Freq: Three times a day (TID) | ORAL | 0 refills | Status: AC

## 2017-06-05 MED ORDER — IBUPROFEN 800 MG PO TABS: 800.0000 mg | ORAL_TABLET | Freq: Three times a day (TID) | ORAL | 0 refills | Status: AC

## 2017-06-05 MED ORDER — CLINDAMYCIN HCL 150 MG PO CAPS
450.0000 mg | ORAL_CAPSULE | Freq: Once | ORAL | Status: AC
  Administered 2017-06-05: 450 mg via ORAL
  Filled 2017-06-05: qty 3

## 2017-06-05 NOTE — ED Notes (Signed)
Swallowing and speech WNL. Airway intact, breathing WNL

## 2017-06-05 NOTE — ED Notes (Signed)
DC instructions reviewed with pt, stressed the importance of making a follow up appt with a Dental Clinic as per EDP dc instructions. Also reviewed and explained the Rx as written by ED provider and explained rationale and what each Rx as for. Informed patient how important it is to complete the Abx rx. Opportunity for questions provided.

## 2017-06-05 NOTE — ED Notes (Signed)
Pt presents with "dental pain" pt states pain is located: left upper canine area, some facial swelling noted as well. Onset approx 3 days ago. Pt states gargled with salt water, OTC such as tylenol also used to help with pain control. No relief per pt statement.

## 2017-06-05 NOTE — ED Triage Notes (Signed)
Facial swelling and dental pain.

## 2017-06-05 NOTE — ED Provider Notes (Signed)
[facility] Provider Note   CSN: [id number] Arrival date & time: 06/05/17  1055     History   Chief Complaint Chief Complaint  Patient presents with  . Dental Pain    HPI Jack Coleman is a 41 y.o. male.  The history is provided by the patient. No language interpreter was used.  Dental Pain     Jack Coleman is a 41 y.o. male who presents to the Emergency Department complaining of dental pain.  He reports 1 week of dental pain to the left upper teeth. Over the last 3 days he's noted some local swelling into his cheek. No fevers, chills, systemic symptoms, pain with swallowing, difficulty breathing, vomiting. He does not have a dentist. His are moderate, constant and worsening. Past Medical History:  Diagnosis Date  . Back pain   . Drug abuse   . Kidney stones   . Renal disorder     There are no active problems to display for this patient.   Past Surgical History:  Procedure Laterality Date  . WRIST SURGERY         Home Medications    Prior to Admission medications   Medication Sig Start Date End Date Taking? Authorizing Provider  acetaminophen (TYLENOL) 500 MG tablet Take 1,000 mg by mouth every 6 (six) hours as needed for moderate pain. Patient used this medication for pain.    [provider]  clindamycin (CLEOCIN) 150 MG capsule Take 3 capsules (450 mg total) by mouth 3 (three) times daily. 06/05/17   Tilden Fossa, MD  diazepam (VALIUM) 5 MG tablet Take 1 tablet (5 mg total) by mouth 2 (two) times daily. 08/23/16   Garlon Hatchet, PA-C  ibuprofen (ADVIL,MOTRIN) 800 MG tablet Take 1 tablet (800 mg total) by mouth 3 (three) times daily. 06/05/17   Tilden Fossa, MD    Family History No family history on file.  Social History Social History  Substance Use Topics  . Smoking status: Current Every Day Smoker    Packs/day: 1.00    Types: Cigarettes  . Smokeless tobacco: Never Used  . Alcohol use No     Allergies   Darvocet  [propoxyphene n-acetaminophen]; Morphine and related; Ibuprofen; and Ultram [tramadol]   Review of Systems Review of Systems  All other systems reviewed and are negative.    Physical Exam Updated Vital Signs BP 119/83   Pulse (!) 111   Temp 98.3 F (36.8 C) (Oral)   Resp 16   Ht 6\' 1"  (1.854 m)   Wt 86.2 kg (190 lb)   SpO2 99%   BMI 25.07 kg/m   Physical Exam  Constitutional: He is oriented to person, place, and time. He appears well-developed and well-nourished.  HENT:  Head: Normocephalic and atraumatic.  Mouth/Throat:    Mild swelling and erythema to the left maxillary region. Poor dentition with multiple carious teeth, many fractured at the gingiva.  Eyes: Pupils are equal, round, and reactive to light. EOM are normal.  Neck: Neck supple.  Cardiovascular: Normal rate and regular rhythm.   No murmur heard. Pulmonary/Chest: Effort normal and breath sounds normal. No stridor. No respiratory distress.  Musculoskeletal: He exhibits no edema or tenderness.  Neurological: He is alert and oriented to person, place, and time.  No asymmetry of facial muscles.  Skin: Skin is warm and dry.  Psychiatric: He has a normal mood and affect. His behavior is normal.  Nursing note and vitals reviewed.    ED Treatments / Results  Labs (all labs ordered are listed, but only abnormal results are displayed) Labs Reviewed - No data to display  EKG  EKG Interpretation None       Radiology No results found.  Procedures Procedures (including critical care time)  Medications Ordered in ED Medications  clindamycin (CLEOCIN) capsule 450 mg (not administered)     Initial Impression / Assessment and Plan / ED Course  I have reviewed the triage vital signs and the nursing notes.  Pertinent labs & imaging results that were available during my care of the patient were reviewed by me and considered in my medical decision making (see chart for details).     Patient here for  evaluation of dental pain. Examination demonstrates poor dentition with mild facial cellulitis and concern for developing abscess the periapical region of the left canine. He is nontoxic appearing on examination and well-hydrated. Discussed with patient concern for developing abscess and cellulitis and need for antibiotics. Counseled pt on home care with oral fluid hydration, antibiotics, ibuprofen and Tylenol for pain. Discussed importance of return precautions if he has any worsening symptoms.  Final Clinical Impressions(s) / ED Diagnoses   Final diagnoses:  Dental abscess    New Prescriptions New Prescriptions   CLINDAMYCIN (CLEOCIN) 150 MG CAPSULE    Take 3 capsules (450 mg total) by mouth 3 (three) times daily.   IBUPROFEN (ADVIL,MOTRIN) 800 MG TABLET    Take 1 tablet (800 mg total) by mouth 3 (three) times daily.     Tilden Fossa, MD 06/05/17 1131

## 2017-06-05 NOTE — Discharge Instructions (Addendum)
You have been seen by your caregiver because of dental pain.  °SEEK MEDICAL ATTENTION IF: °The exam and treatment you received today has been provided on an emergency basis only. This is not a substitute for complete medical or dental care. If your problem worsens or new symptoms (problems) appear, and you are unable to arrange prompt follow-up care with your dentist, call or return to this location. °CALL YOUR DENTIST OR RETURN IMMEDIATELY IF you develop a fever, rash, difficulty breathing or swallowing, neck or facial swelling, or other potentially serious concerns. ° ° °[employer]  °[employer]  °[facility]  °[address]  °[place], [place] [address]  °Phone [phone]  °[facility] [employer] [facility] in [place], [place], exemplifies the Dental School?s vision to improve the health and quality of life of all [place]ians by creating leaders with a passion to care for the underserved and by leading the nation in community-based, service learning oral health education. °We are committed to offering comprehensive general dental services for adults, children and special needs patients in a safe, caring and professional setting. ° °Appointments: Our clinic is open Monday through Friday 8:00 a.m. until 5:00 p.m. The amount of time scheduled for an appointment depends on the patient?s specific needs. We ask that you keep your appointed time for care or provide 24-hour notice of all appointment changes. Parents or legal guardians must accompany minor children. ° °Payment for Services: Medicaid and other insurance plans are welcome. Payment for services is due when services are rendered and may be made by cash or credit card. If you have dental insurance, we will assist you with your claim submission.  ° °Emergencies: Emergency services will be provided Monday through Friday on a  walk-in basis. Please arrive early for emergency services. After hours emergency services will be provided for patients of record as required. ° °Services:  °Comprehensive General Dentistry  °Children?s Dentistry  °Oral Surgery - Extractions  °Root Canals  °Sealants and Tooth Colored Fillings  °Crowns and Bridges  °Dentures and Partial Dentures  °Implant Services  °Periodontal Services and Cleanings  °Cosmetic Tooth Whitening  °Digital Radiography  °3-D/[facility] Beam Imaging ° ° °

## 2018-07-23 ENCOUNTER — Encounter (HOSPITAL_BASED_OUTPATIENT_CLINIC_OR_DEPARTMENT_OTHER): Payer: Self-pay

## 2018-07-23 ENCOUNTER — Emergency Department (HOSPITAL_BASED_OUTPATIENT_CLINIC_OR_DEPARTMENT_OTHER)
Admission: EM | Admit: 2018-07-23 | Discharge: 2018-07-23 | Disposition: A | Discharge status: Home / Self Care | Payer: Self-pay | Attending: Emergency Medicine | Admitting: Emergency Medicine

## 2018-07-23 ENCOUNTER — Other Ambulatory Visit: Payer: Self-pay

## 2018-07-23 ENCOUNTER — Other Ambulatory Visit: Payer: Self-pay | Admitting: Physician Assistant

## 2018-07-23 DIAGNOSIS — F17210 Nicotine dependence, cigarettes, uncomplicated: Secondary | ICD-10-CM | POA: Insufficient documentation

## 2018-07-23 DIAGNOSIS — L0390 Cellulitis, unspecified: Secondary | ICD-10-CM

## 2018-07-23 DIAGNOSIS — Z79899 Other long term (current) drug therapy: Secondary | ICD-10-CM | POA: Insufficient documentation

## 2018-07-23 DIAGNOSIS — L02413 Cutaneous abscess of right upper limb: Secondary | ICD-10-CM | POA: Insufficient documentation

## 2018-07-23 DIAGNOSIS — L03113 Cellulitis of right upper limb: Secondary | ICD-10-CM | POA: Insufficient documentation

## 2018-07-23 LAB — CBC WITH DIFFERENTIAL/PLATELET
Abs Immature Granulocytes: 0.04 10*3/uL (ref 0.00–0.07)
BASOS ABS: 0 10*3/uL (ref 0.0–0.1)
BASOS PCT: 0 %
EOS ABS: 0.1 10*3/uL (ref 0.0–0.5)
Eosinophils Relative: 0 %
HCT: 39.7 % (ref 39.0–52.0)
Hemoglobin: 13.2 g/dL (ref 13.0–17.0)
Immature Granulocytes: 0 %
Lymphocytes Relative: 18 %
Lymphs Abs: 2.4 10*3/uL (ref 0.7–4.0)
MCH: 28.8 pg (ref 26.0–34.0)
MCHC: 33.2 g/dL (ref 30.0–36.0)
MCV: 86.7 fL (ref 80.0–100.0)
Monocytes Absolute: 1 10*3/uL (ref 0.1–1.0)
Monocytes Relative: 7 %
NEUTROS ABS: 9.9 10*3/uL — AB (ref 1.7–7.7)
NEUTROS PCT: 75 %
NRBC: 0 % (ref 0.0–0.2)
PLATELETS: 187 10*3/uL (ref 150–400)
RBC: 4.58 MIL/uL (ref 4.22–5.81)
RDW: 12.4 % (ref 11.5–15.5)
WBC: 13.4 10*3/uL — AB (ref 4.0–10.5)

## 2018-07-23 LAB — BASIC METABOLIC PANEL
Anion gap: 7 (ref 5–15)
BUN: 10 mg/dL (ref 6–20)
CALCIUM: 8.8 mg/dL — AB (ref 8.9–10.3)
CHLORIDE: 98 mmol/L (ref 98–111)
CO2: 26 mmol/L (ref 22–32)
CREATININE: 0.73 mg/dL (ref 0.61–1.24)
Glucose, Bld: 134 mg/dL — ABNORMAL HIGH (ref 70–99)
Potassium: 3.3 mmol/L — ABNORMAL LOW (ref 3.5–5.1)
SODIUM: 131 mmol/L — AB (ref 135–145)

## 2018-07-23 MED ORDER — VANCOMYCIN HCL IN DEXTROSE 1-5 GM/200ML-% IV SOLN
1000.0000 mg | Freq: Once | INTRAVENOUS | Status: AC
  Administered 2018-07-23: 1000 mg via INTRAVENOUS
  Filled 2018-07-23: qty 200

## 2018-07-23 MED ORDER — DOXYCYCLINE HYCLATE 100 MG PO CAPS: 100.0000 mg | ORAL_CAPSULE | Freq: Two times a day (BID) | ORAL | 0 refills | Status: AC

## 2018-07-23 MED ORDER — SODIUM CHLORIDE 0.9 % IV BOLUS
1000.0000 mL | Freq: Once | INTRAVENOUS | Status: AC
  Administered 2018-07-23: 1000 mL via INTRAVENOUS

## 2018-07-23 MED ORDER — LIDOCAINE-EPINEPHRINE (PF) 2 %-1:200000 IJ SOLN
10.0000 mL | Freq: Once | INTRAMUSCULAR | Status: AC
  Administered 2018-07-23: 10 mL via INTRADERMAL
  Filled 2018-07-23 (×2): qty 10

## 2018-07-23 NOTE — ED Triage Notes (Signed)
C/o ?insect bite to right UE-redness/swelling from hand to mid FA-denies as IV drug site-NAD-steady gait

## 2018-07-23 NOTE — Discharge Instructions (Addendum)
You were given a prescription for antibiotics. Please take the antibiotic prescription fully.   You will need to return for wound reevaluation and packing removal in 48 hours.  If the redness spreads beyond the current borders or you develop fevers chills or other signs of worsening infection then you will need to return to the emergency department soon as possible.

## 2018-07-23 NOTE — ED Provider Notes (Signed)
[facility] EMERGENCY DEPARTMENT Provider Note   CSN: [id number] Arrival date & time: 07/23/18  1141     History   Chief Complaint Chief Complaint  Patient presents with  . Insect Bite    HPI Jack Coleman is a 42 y.o. male.  HPI   Patient is a 42 year old male with history of nephrolithiasis, IVDU who presents emergency department today for evaluation of right wrist pain and skin infection.  Patient states that he noticed a small spider on his right forearm the other day.  He then developed itching and pain to the right wrist.  Last night and this morning he noticed increased redness swelling and warmth to the right wrist as well as purulent drainage from a wound to the right wrist.  Is able to move the wrist freely move the hand freely without pain.  No known fevers or chills at home.   Patient denies that he has injected drugs in the past 2 years and denies injecting in this area.  Records reviewed, patient was admitted for overdose at an outside hospital less than a month ago.  Past Medical History:  Diagnosis Date  . Back pain   . Drug abuse (HCC)   . Kidney stones   . Renal disorder     There are no active problems to display for this patient.   Past Surgical History:  Procedure Laterality Date  . WRIST SURGERY          Home Medications    Prior to Admission medications   Medication Sig Start Date End Date Taking? Authorizing Provider  acetaminophen (TYLENOL) 500 MG tablet Take 1,000 mg by mouth every 6 (six) hours as needed for moderate pain. Patient used this medication for pain.    [provider]  clindamycin (CLEOCIN) 150 MG capsule Take 3 capsules (450 mg total) by mouth 3 (three) times daily. 06/05/17   Tilden Fossa, MD  diazepam (VALIUM) 5 MG tablet Take 1 tablet (5 mg total) by mouth 2 (two) times daily. 08/23/16   Garlon Hatchet, PA-C  doxycycline (VIBRAMYCIN) 100 MG capsule Take 1 capsule (100 mg total) by mouth 2 (two) times  daily for 7 days. 07/23/18 07/30/18  Kadesia Robel S, PA-C  ibuprofen (ADVIL,MOTRIN) 800 MG tablet Take 1 tablet (800 mg total) by mouth 3 (three) times daily. 06/05/17   Tilden Fossa, MD    Family History No family history on file.  Social History Social History   Tobacco Use  . Smoking status: Current Every Day Smoker    Packs/day: 1.00    Types: Cigarettes  . Smokeless tobacco: Never Used  Substance Use Topics  . Alcohol use: No  . Drug use: Not Currently     Allergies   Darvocet [propoxyphene n-acetaminophen]; Morphine and related; Ibuprofen; and Ultram [tramadol]   Review of Systems Review of Systems  Constitutional: Negative for chills and fever.  HENT: Negative for ear pain and sore throat.   Eyes: Negative for pain and visual disturbance.  Respiratory: Negative for cough and shortness of breath.   Cardiovascular: Negative for chest pain.  Gastrointestinal: Negative for abdominal pain, nausea and vomiting.  Genitourinary: Negative for dysuria and hematuria.  Musculoskeletal:       Right wrist pain  Skin: Positive for color change and wound.  Neurological: Negative for headaches.  All other systems reviewed and are negative.   Physical Exam Updated Vital Signs BP 121/71 (BP Location: Left Arm)   Pulse 90   Temp  99.7 F (37.6 C) (Oral)   Resp 18   Ht 6\' 1"  (1.854 m)   Wt 84.4 kg   SpO2 100%   BMI 24.54 kg/m   Physical Exam  Constitutional: He appears well-developed and well-nourished.  HENT:  Head: Normocephalic and atraumatic.  Eyes: Conjunctivae are normal.  Neck: Neck supple.  Cardiovascular: Normal rate and regular rhythm.  No murmur heard. Pulmonary/Chest: Effort normal and breath sounds normal. No respiratory distress.  Abdominal: Soft. There is no tenderness.  Musculoskeletal:  TTP over the right distal ulna, with overlying abscess and surrounding erythema/edema. Central area of fluctuance present and wound is actively draining purulent  mateiral. Wound pictured below. No TTP throughout the wrist joint. TTP throughout the dorsal aspect of the right hand with overlying swelling. Normal and painless ROM of the wrist joint and hand. NVI.   Neurological: He is alert.  Skin: Skin is warm and dry.  Psychiatric: He has a normal mood and affect.  Nursing note and vitals reviewed.         ED Treatments / Results  Labs (all labs ordered are listed, but only abnormal results are displayed) Labs Reviewed  CBC WITH DIFFERENTIAL/PLATELET - Abnormal; Notable for the following components:      Result Value   WBC 13.4 (*)    Neutro Abs 9.9 (*)    All other components within normal limits  BASIC METABOLIC PANEL - Abnormal; Notable for the following components:   Sodium 131 (*)    Potassium 3.3 (*)    Glucose, Bld 134 (*)    Calcium 8.8 (*)    All other components within normal limits  AEROBIC/ANAEROBIC CULTURE (SURGICAL/DEEP WOUND)      EKG None  Radiology No results found.  Procedures .Marland KitchenIncision and Drainage Date/Time: 07/23/2018 3:12 PM Performed by: Karrie Meres, PA-C Authorized by: Karrie Meres, PA-C   Consent:    Consent obtained:  Verbal   Consent given by:  Patient   Risks discussed:  Bleeding, incomplete drainage and pain Location:    Type:  Abscess   Size:  2cm Pre-procedure details:    Skin preparation:  Betadine Anesthesia (see MAR for exact dosages):    Anesthesia method:  Local infiltration   Local anesthetic:  Lidocaine 2% WITH epi Procedure type:    Complexity:  Complex Procedure details:    Scalpel blade:  11   Wound management:  Probed and deloculated and irrigated with saline   Drainage:  Bloody and purulent   Drainage amount:  Moderate   Wound treatment:  Wound left open   Packing materials:  1/2 in iodoform gauze Post-procedure details:    Patient tolerance of procedure:  Tolerated well, no immediate complications   (including critical care time)  Medications Ordered  in ED Medications  sodium chloride 0.9 % bolus 1,000 mL (0 mLs Intravenous Stopped 07/23/18 1506)  vancomycin (VANCOCIN) IVPB 1000 mg/200 mL premix (0 mg Intravenous Stopped 07/23/18 1505)  lidocaine-EPINEPHrine (XYLOCAINE W/EPI) 2 %-1:200000 (PF) injection 10 mL (10 mLs Intradermal Given 07/23/18 1336)     Initial Impression / Assessment and Plan / ED Course  I have reviewed the triage vital signs and the nursing notes.  Pertinent labs & imaging results that were available during my care of the patient were reviewed by me and considered in my medical decision making (see chart for details).  Final Clinical Impressions(s) / ED Diagnoses   Final diagnoses:  Cellulitis, unspecified cellulitis site   Patient with history of  IV drug use presenting with right wrist abscess that began several days ago and is now draining purulent fluid.  Initially with tachycardia at 108, no fevers and otherwise vitals are stable.  Nontoxic-appearing.  Denies systemic signs or symptoms of infection.  Lab work, IV antibiotics, and fluids ordered.  Bedside I&D was completed with cultures obtained. Wound packing was placed.   CBC with mild leukocytosis at 13.4.  No anemia with. BMP with mild hyponatremia and hypokalemia  Patient without signs of sepsis.  He is nontoxic-appearing no systemic symptoms of infection.  Gave a dose of IV vancomycin in the ED as well as fluids.  Will send home with Rx for doxycycline and have him follow-up in 2 days for wound recheck and packing removal.  Advised him to return sooner for signs of worsening infection.  He voices understanding of plan and reasons to return to ED. all questions answered.  Pt seen in conjunction with Dr. Adela Lank who agrees with the above plan for discharge and close f/u.  ED Discharge Orders         Ordered    doxycycline (VIBRAMYCIN) 100 MG capsule  2 times daily     07/23/18 1507           Makynleigh Breslin S, PA-C 07/23/18 1515    Melene Plan,  DO 07/23/18 1518

## 2020-06-08 ENCOUNTER — Encounter (HOSPITAL_BASED_OUTPATIENT_CLINIC_OR_DEPARTMENT_OTHER): Payer: Self-pay | Admitting: Emergency Medicine

## 2020-06-08 ENCOUNTER — Other Ambulatory Visit: Payer: Self-pay

## 2020-06-08 DIAGNOSIS — L02413 Cutaneous abscess of right upper limb: Secondary | ICD-10-CM | POA: Insufficient documentation

## 2020-06-08 DIAGNOSIS — R2243 Localized swelling, mass and lump, lower limb, bilateral: Secondary | ICD-10-CM | POA: Insufficient documentation

## 2020-06-08 DIAGNOSIS — Z5321 Procedure and treatment not carried out due to patient leaving prior to being seen by health care provider: Secondary | ICD-10-CM | POA: Insufficient documentation

## 2020-06-08 DIAGNOSIS — L02416 Cutaneous abscess of left lower limb: Secondary | ICD-10-CM | POA: Insufficient documentation

## 2020-06-08 DIAGNOSIS — L02415 Cutaneous abscess of right lower limb: Secondary | ICD-10-CM | POA: Insufficient documentation

## 2020-06-08 DIAGNOSIS — L02414 Cutaneous abscess of left upper limb: Secondary | ICD-10-CM | POA: Insufficient documentation

## 2020-06-08 LAB — CBC WITH DIFFERENTIAL/PLATELET
Abs Immature Granulocytes: 0.03 10*3/uL (ref 0.00–0.07)
Basophils Absolute: 0 10*3/uL (ref 0.0–0.1)
Basophils Relative: 0 %
Eosinophils Absolute: 0.2 10*3/uL (ref 0.0–0.5)
Eosinophils Relative: 3 %
HCT: 40.4 % (ref 39.0–52.0)
Hemoglobin: 13.1 g/dL (ref 13.0–17.0)
Immature Granulocytes: 0 %
Lymphocytes Relative: 28 %
Lymphs Abs: 2.5 10*3/uL (ref 0.7–4.0)
MCH: 28.4 pg (ref 26.0–34.0)
MCHC: 32.4 g/dL (ref 30.0–36.0)
MCV: 87.6 fL (ref 80.0–100.0)
Monocytes Absolute: 0.5 10*3/uL (ref 0.1–1.0)
Monocytes Relative: 5 %
Neutro Abs: 5.8 10*3/uL (ref 1.7–7.7)
Neutrophils Relative %: 64 %
Platelets: 211 10*3/uL (ref 150–400)
RBC: 4.61 MIL/uL (ref 4.22–5.81)
RDW: 13.3 % (ref 11.5–15.5)
WBC: 9.1 10*3/uL (ref 4.0–10.5)
nRBC: 0 % (ref 0.0–0.2)

## 2020-06-08 LAB — COMPREHENSIVE METABOLIC PANEL
ALT: 123 U/L — ABNORMAL HIGH (ref 0–44)
AST: 125 U/L — ABNORMAL HIGH (ref 15–41)
Albumin: 3.5 g/dL (ref 3.5–5.0)
Alkaline Phosphatase: 120 U/L (ref 38–126)
Anion gap: 10 (ref 5–15)
BUN: 16 mg/dL (ref 6–20)
CO2: 25 mmol/L (ref 22–32)
Calcium: 8.8 mg/dL — ABNORMAL LOW (ref 8.9–10.3)
Chloride: 99 mmol/L (ref 98–111)
Creatinine, Ser: 0.82 mg/dL (ref 0.61–1.24)
GFR calc Af Amer: >60 mL/min (ref >60)
GFR calc non Af Amer: >60 mL/min (ref >60)
Glucose, Bld: 149 mg/dL — ABNORMAL HIGH (ref 70–99)
Potassium: 3.5 mmol/L (ref 3.5–5.1)
Sodium: 134 mmol/L — ABNORMAL LOW (ref 135–145)
Total Bilirubin: 0.6 mg/dL (ref 0.3–1.2)
Total Protein: 8.5 g/dL — ABNORMAL HIGH (ref 6.5–8.1)

## 2020-06-08 LAB — BRAIN NATRIURETIC PEPTIDE: B Natriuretic Peptide: 34.1 pg/mL (ref 0.0–100.0)

## 2020-06-08 NOTE — ED Triage Notes (Signed)
Pt c/o multiple abscess to arms  And legs . Also c/o bil leg swelling x 3 days

## 2020-06-09 ENCOUNTER — Emergency Department (HOSPITAL_BASED_OUTPATIENT_CLINIC_OR_DEPARTMENT_OTHER)
Admission: EM | Admit: 2020-06-09 | Discharge: 2020-06-09 | Disposition: A | Discharge status: Home / Self Care | Payer: Self-pay | Attending: Emergency Medicine | Admitting: Emergency Medicine

## 2020-06-09 HISTORY — DX: Other psychoactive substance abuse, uncomplicated: F19.10
